# Patient Record
Sex: Male | Born: 1980 | Race: White | Hispanic: No | Marital: Married | Smoking: Current every day smoker
Health system: Southern US, Community
[De-identification: ages and names within clinical notes are randomized; demographics above are authoritative.]

## PROBLEM LIST (undated history)

## (undated) DIAGNOSIS — M549 Dorsalgia, unspecified: Secondary | ICD-10-CM

## (undated) HISTORY — PX: FOREARM SURGERY: SHX651

## (undated) HISTORY — PX: ADENOIDECTOMY: SUR15

## (undated) HISTORY — PX: TYMPANOSTOMY TUBE PLACEMENT: SHX32

---

## 2007-07-17 ENCOUNTER — Emergency Department (HOSPITAL_COMMUNITY): Admission: EM | Admit: 2007-07-17 | Discharge: 2007-07-18 | Payer: Self-pay | Admitting: Emergency Medicine

## 2007-07-19 ENCOUNTER — Inpatient Hospital Stay (HOSPITAL_COMMUNITY): Admission: EM | Admit: 2007-07-19 | Discharge: 2007-07-23 | Payer: Self-pay | Admitting: Emergency Medicine

## 2008-03-17 ENCOUNTER — Emergency Department (HOSPITAL_COMMUNITY): Admission: EM | Admit: 2008-03-17 | Discharge: 2008-03-17 | Payer: Self-pay | Admitting: Emergency Medicine

## 2008-07-21 ENCOUNTER — Emergency Department (HOSPITAL_COMMUNITY): Admission: EM | Admit: 2008-07-21 | Discharge: 2008-07-21 | Payer: Self-pay | Admitting: Emergency Medicine

## 2008-09-21 IMAGING — CR DG HAND COMPLETE 3+V*L*
3 series · 3 of 3 positions shown · non-contrast
Comparison: 07/17/2007

CLINICAL DATA: 26-year-old with infection in fourth digit with pain and swelling.
 LEFT HAND ? 3 VIEW:

[view not recorded (1 of 3)]
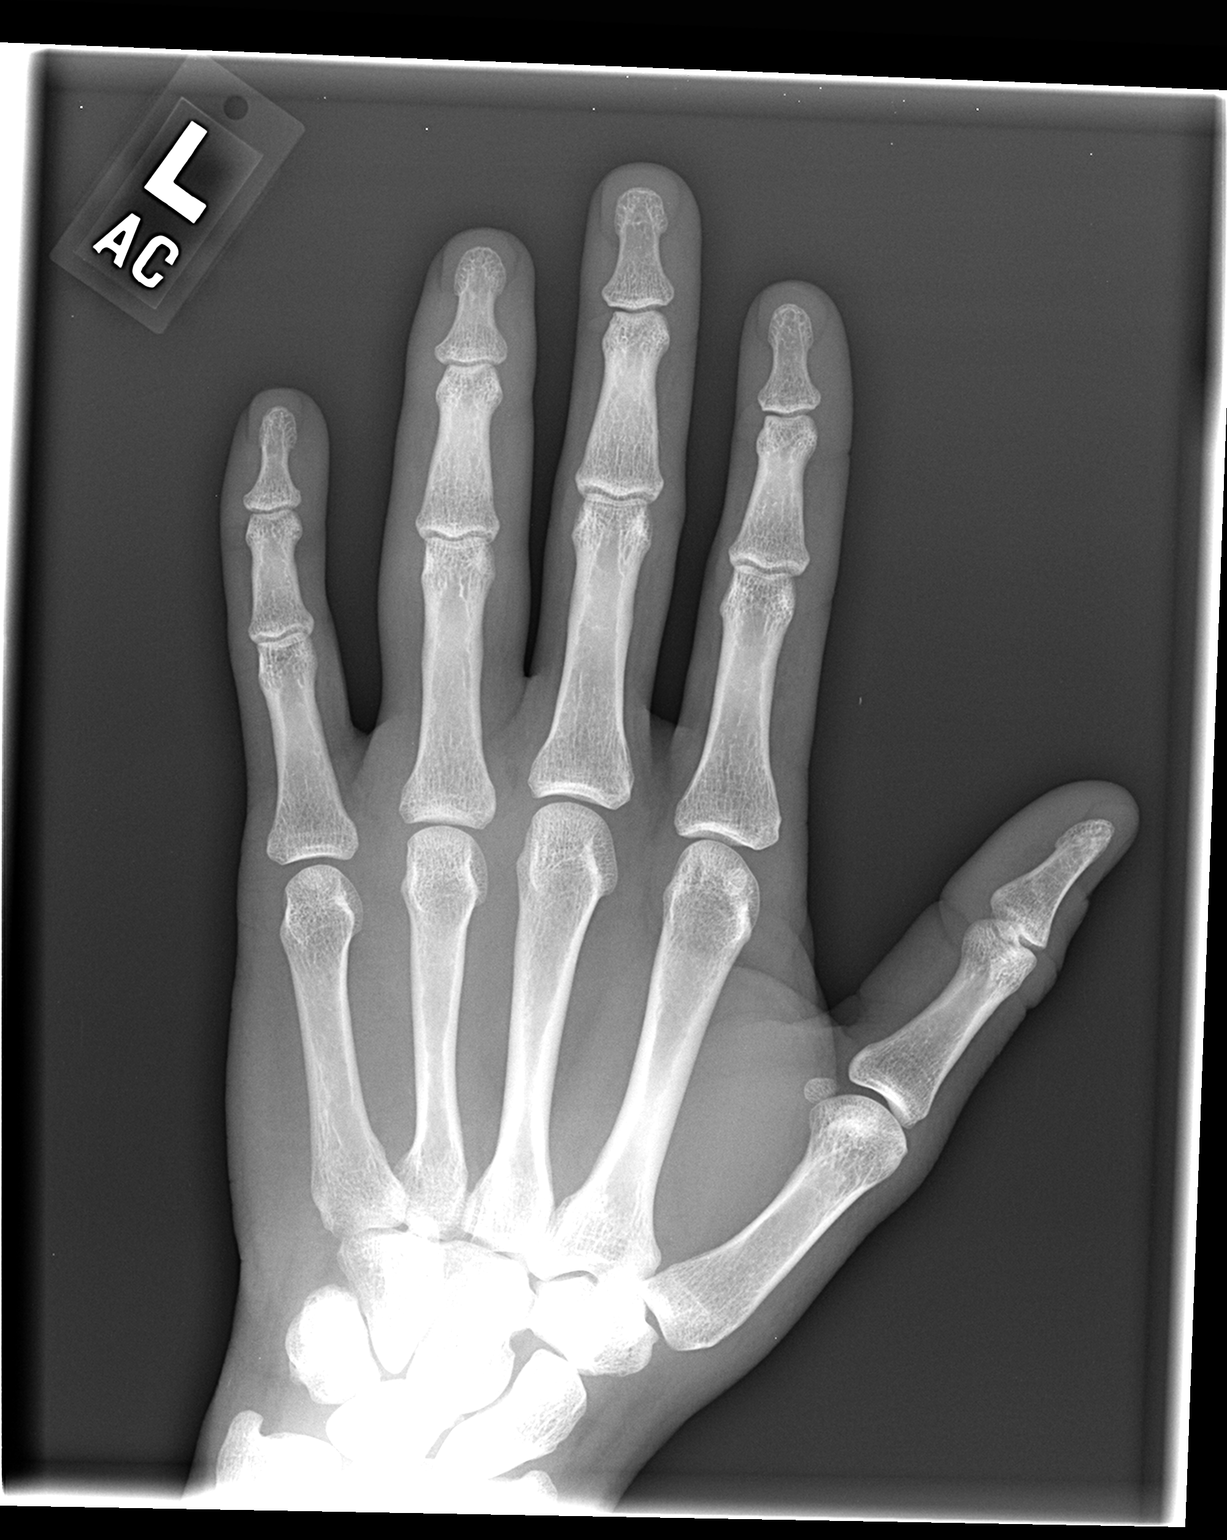

[view not recorded (2 of 3)]
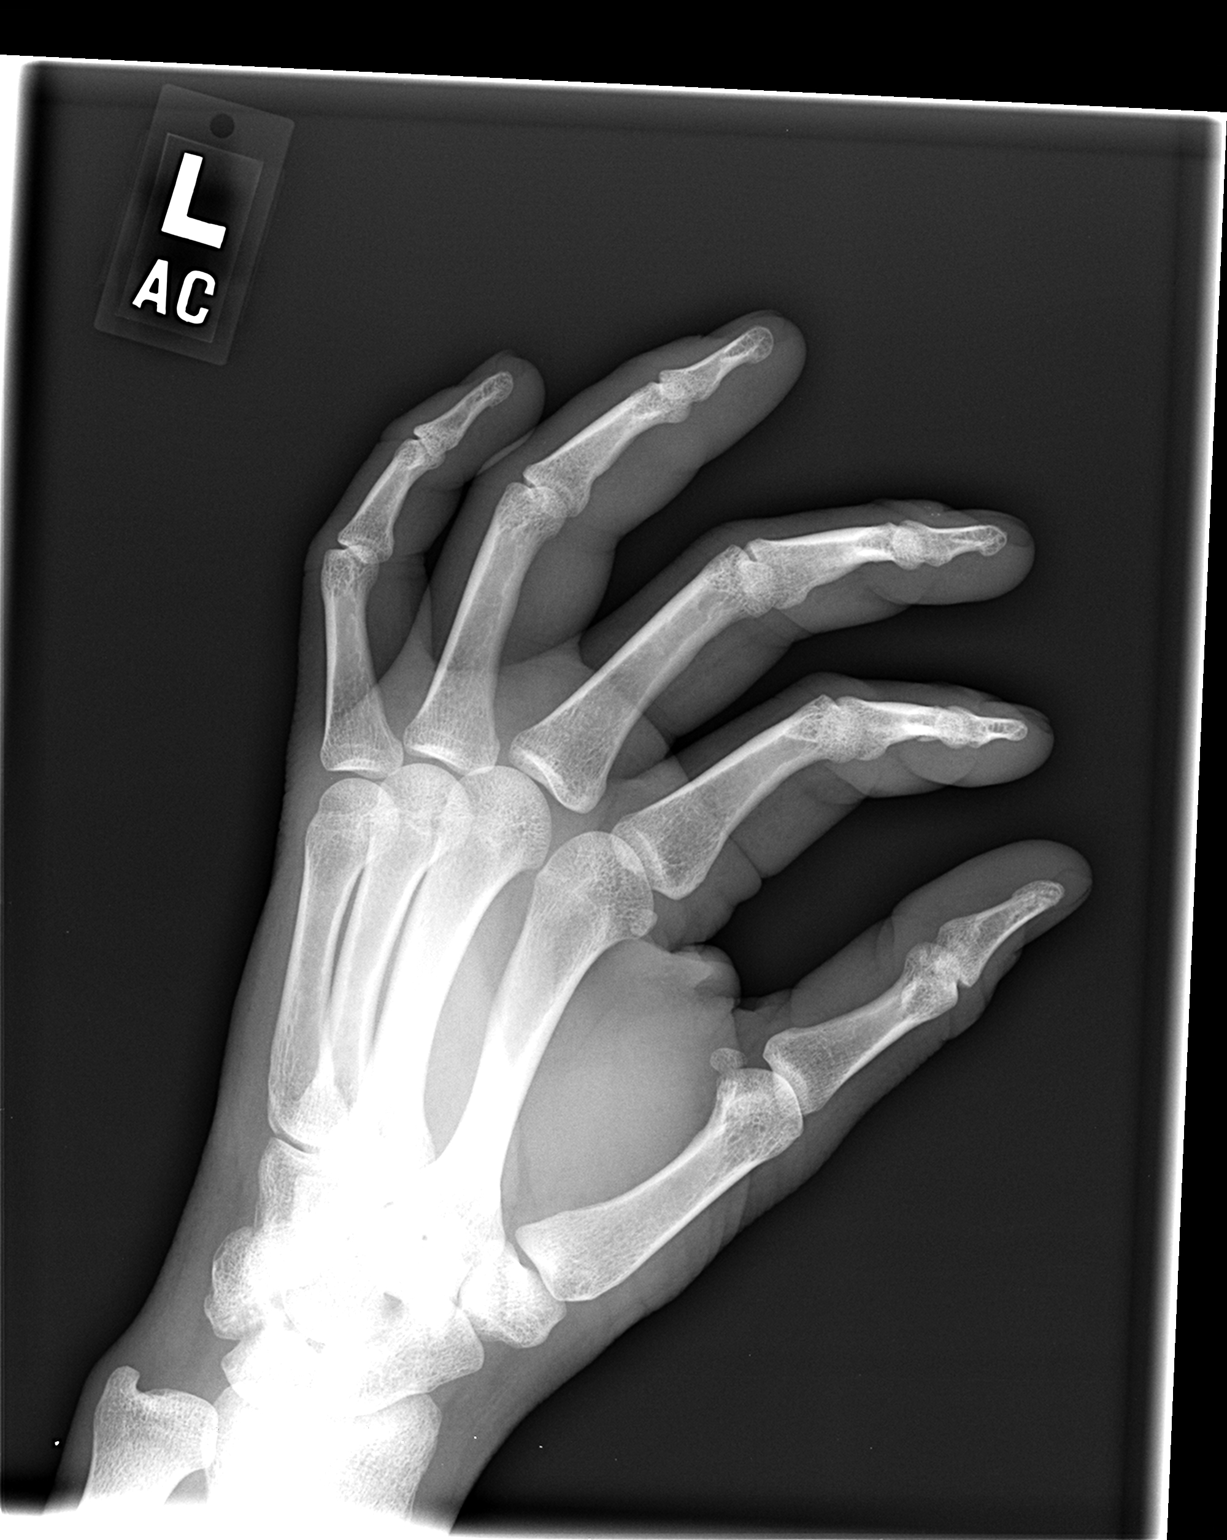

[view not recorded (3 of 3)]
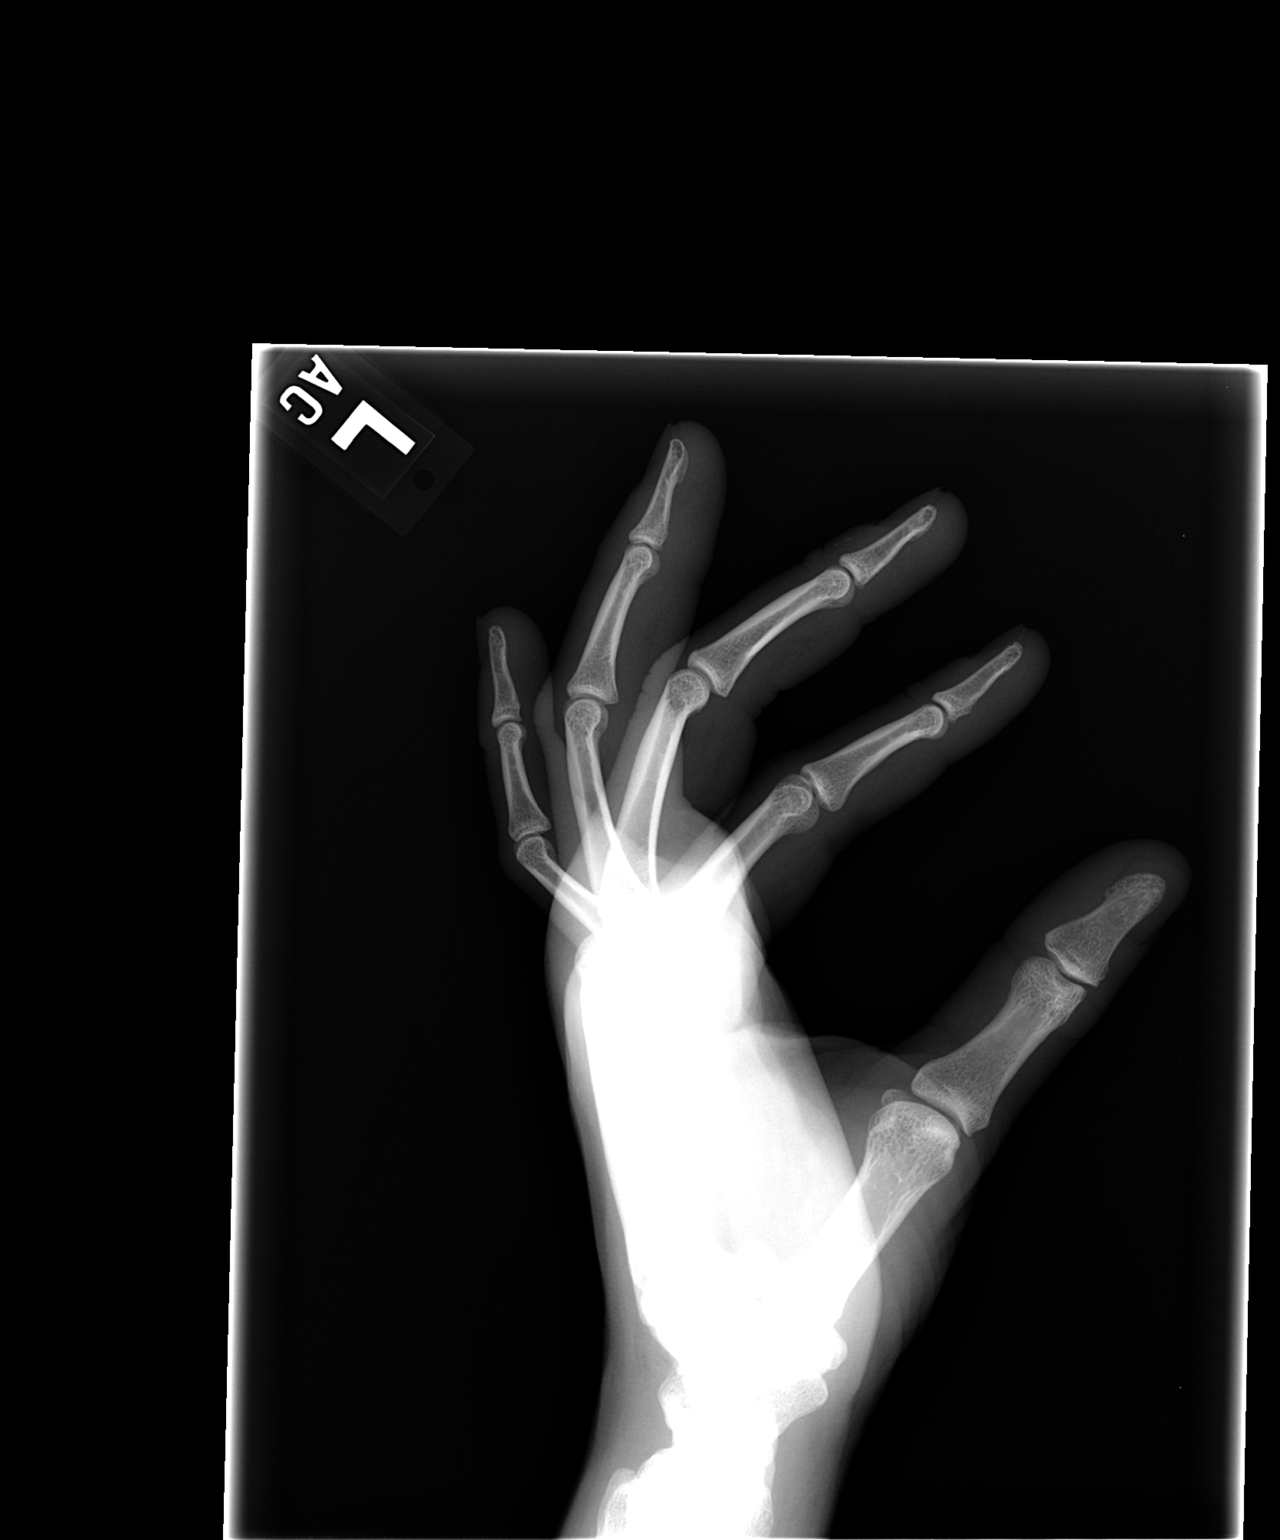

[3 of 3 positions shown; findings below may reference images not displayed]

FINDINGS: There is diffuse soft tissue swelling involving the left ring finger.  The joint spaces are maintained.  I do not see any findings suspicious for septic arthritis or osteomyelitis.  No fractures are seen. Remainder of the hand is unremarkable.
IMPRESSION: Diffuse soft tissue swelling involving the left ring finger but no acute bony findings, destructive bony changes or plain film findings suggestive of septic arthritis.

## 2008-09-23 ENCOUNTER — Emergency Department (HOSPITAL_COMMUNITY): Admission: EM | Admit: 2008-09-23 | Discharge: 2008-09-23 | Payer: Self-pay | Admitting: Emergency Medicine

## 2009-07-30 ENCOUNTER — Emergency Department (HOSPITAL_COMMUNITY): Admission: EM | Admit: 2009-07-30 | Discharge: 2009-07-30 | Payer: Self-pay | Admitting: Emergency Medicine

## 2011-01-17 NOTE — H&P (Signed)
Dillon Brown, Dillon Brown             ACCOUNT NO.:  [id number]   MEDICAL RECORD NO.:  [id number]          PATIENT TYPE:  INP   LOCATION:  A318                          FACILITY:  [facility]   PHYSICIAN:  Skeet Latch, DO    DATE OF BIRTH:  08/18/1981   DATE OF ADMISSION:  07/19/2007  DATE OF DISCHARGE:  LH                              HISTORY & PHYSICAL   CHIEF COMPLAINT:  Left hand swelling.   HISTORY OF PRESENT ILLNESS:  This is a 30 year old Caucasian male who  presents with left hand swelling.  Apparently, about a week and a half  ago, the patient states that he noticed that his left ring finger  started to swell at the tip.  This has gradually been increasing over  the last week.  The patient does a lot of work with his hands and is  unsure if this is related to the type of work he does or if he was  bitten by some type of insect.  Patient presented to the emergency room  on November 15th with similar problems.  He was given an antibiotic and  pain reliever.  Patient states that he took this medication the next  day, and the finger looked worse, so he came back to the emergency room  for evaluation.  Patient has no significant medical conditions and does  not see a physician regularly.   PAST MEDICAL HISTORY:  Unremarkable.   FAMILY HISTORY:  Unremarkable.   PAST SURGICAL HISTORY:  Adenoidectomy.   SOCIAL HISTORY:  One pack per day smoker for approximately 10 years.  States that he occasionally drinks alcohol.  He denies any drug abuse.   ALLERGIES:  No known drug allergies.   CURRENT HOME MEDICATIONS:  Patient was taking Bactrim as directed and  Vicodin as needed.   REVIEW OF SYSTEMS:  The only pertinent positives is left ring finger  swelling and pain.  CARDIOVASCULAR:  Unremarkable.  GENITOURINARY:  Unremarkable.  HEENT:  Unremarkable.  GASTROINTESTINAL:  Unremarkable.  NEUROLOGIC:  Unremarkable.  Other systems are unremarkable.   PHYSICAL EXAMINATION:  Temperature 98.9,  pulse 80, respirations 18,  blood pressure 97/62.  GENERAL:  Patient is well-developed, well-nourished, well-hydrated in no  acute distress.  Pleasant, cooperative, alert and oriented.  HEENT:  Head is atraumatic and normocephalic.  No scleral icterus is  noted.  NECK:  Soft, supple, nontender, nondistended.  CARDIOVASCULAR:  Regular rate and rhythm.  No murmurs, rubs or gallops.  LUNGS:  Clear to auscultation bilaterally.  No rales, rhonchi or  wheezes.  ABDOMEN:  Soft, nontender, nondistended.  No rigidity or guarding.  Positive bowel sounds.  EXTREMITIES:  Left ring finger has erythematous swelling that extends to  his metacarpals.  No drainage noted.  He has painful palpation and when  he flexes his finger.  NEUROLOGIC:  Cranial nerves II-XII are grossly intact.  PSYCHIATRIC:  Alert and oriented x3.  Normal affect.   RADIOLOGIC STUDIES:  Left hand x-ray shows no acute bony findings.   ASSESSMENT:  This is a 30 year old Caucasian male who presents with a 1-  1/2 week history  of left ring finger swelling, erythema, and pain.  Patient was seen in the emergency room one day prior and placed on  antibiotics and pain medication.   PLAN:  Cellulitis.  Patient was placed on vancomycin and Ancef in the  emergency room.  This will be continued at this time.  Also have gotten  blood cultures x2.  Await findings from his blood culture.  Patient will  be given antiemetics as well as IV pain medication.  This will be  adjusted as needed.  Patient will be followed closely.      Skeet Latch, DO  Electronically Signed     SM/MEDQ  D:  07/20/2007  T:  07/20/2007  Job:  [id number]

## 2011-01-17 NOTE — Group Therapy Note (Signed)
Dillon Brown, Dillon Brown             ACCOUNT NO.:  [id number]   MEDICAL RECORD NO.:  [id number]          PATIENT TYPE:  INP   LOCATION:  A318                          FACILITY:  [facility]   PHYSICIAN:  Skeet Latch, DO    DATE OF BIRTH:  August 06, 1981   DATE OF PROCEDURE:  07/19/2007  DATE OF DISCHARGE:                                 PROGRESS NOTE   SUBJECTIVE:  The patient was complaining of finger pain today after he  awoke this morning. The patient states that the swelling and redness  this morning increased, but as the day has progressed, the pain has  improved.   OBJECTIVE:  VITAL SIGNS:  Temperature 97.5, respirations 20, heart rate  62, blood pressure 92/63.  CARDIOVASCULAR:  Regular rate and rhythm with no murmurs, rubs, or  gallops.  LUNGS:  Clear to auscultation bilaterally. No rales, rhonchi, or  wheezing.  ABDOMEN:  Soft, nontender, nondistended.  MUSCULOSKELETAL:  Left ring finger is still slightly swollen. Redness  has decreased. There are no streaks noted. Tenderness is still present,  but improved today.   LABORATORY DATA:  Pending.   ASSESSMENT AND PLAN:  1. Cellulitis. The patient continues to be on IV vancomycin and Ancef.      Blood cultures so far are negative. The patient does not have a      white count or a temperature at this time. We will continue for a      few more days of IV antibiotics and we will switch to p.o. in the      next 24 to 48 hours if he continues to improve.  2. Tobacco abuse. We will give the patient a nicotine patch and      counsel him regarding his tobacco abuse.   Anticipate discharge in the next few days if the patient improves and  remains afebrile.      Skeet Latch, DO  Electronically Signed     SM/MEDQ  D:  07/21/2007  T:  07/22/2007  Job:  [id number]

## 2011-01-17 NOTE — Discharge Summary (Signed)
Dillon Brown, Dillon Brown             ACCOUNT NO.:  [id number]   MEDICAL RECORD NO.:  [id number]          PATIENT TYPE:  INP   LOCATION:  A318                          FACILITY:  [facility]   PHYSICIAN:  Skeet Latch, DO    DATE OF BIRTH:  1981/05/02   DATE OF ADMISSION:  07/19/2007  DATE OF DISCHARGE:  11/18/2008LH                               DISCHARGE SUMMARY   DISCHARGE DIAGNOSES:  1. Cellulitis.  2. Tobacco abuse.   BRIEF HOSPITAL COURSE:  This is a 30 year old Caucasian male who  presented with left hand/finger swelling.  Apparently about a week and  half prior, the patient states he notices his left ring finger started  to swell at the tip.  This gradually increased over last week to involve  his whole left ring finger.  The patient does a lot of work with his  hands and states that he noticed this and was unsure of the cause.  The  patient went to the emergency room one day prior, was given antibiotic  and pain reliever.  He took his medication, and the next day, he said it  looked worse and came back to the emergency room for evaluation.  The  patient was admitted to a general medical bed for cellulitis of his left  ring finger.  The patient was placed on IV vancomycin and Ancef, and  then blood cultures were obtained.  His blood cultures were negative,  and his lab work was pretty much benign.  The patient continued be on IV  antibiotics and IV pain medication, and ice and elevation was applied to  the left hand.  The patient's pain is slowly improved as well as his  swelling and redness.  The patient did have a hand x-ray which showed  diffuse soft tissue swelling along the left ring finger but no acute  bony findings, no destructive bony changes, no suggestion of septic  arthritis.  At this time, feel patient is stable enough to be discharged  to home.   His vitals on discharge:  Temperature is 98.1, pulse 83, respirations  20, blood pressure 125/69.  His last labs:  Sodium  141, potassium 4.3,  chloride 103, CO2 is 31, glucose 79, BUN 9, creatinine 1.02.  White  count 6.3, hemoglobin 14.1, hematocrit 41.2, platelet count 201.   MEDICATIONS ON DISCHARGE:  Will include:  1. Levaquin 500 mg 1 tablet p.o. daily for 7 days.  2. Vicodin 1-2 tablets p.o. q.4-6h. p.r.n.   CONDITION ON DISCHARGE:  Stable.   DISPOSITION:  The patient will be discharged to home.   INSTRUCTIONS:  The patient is told to follow up with primary care  physician if needed, if swelling does not improve.  The patient is  instructed to maintain a regular diet and increase activity as  tolerated.  The patient is also told to return to the emergency room if  swelling does not improve or if he finds streaks down his arm or any  severe pain.      Skeet Latch, DO  Electronically Signed     SM/MEDQ  D:  07/23/2007  T:  07/23/2007  Job:  [id number]

## 2011-06-13 LAB — DIFFERENTIAL
Basophils Absolute: 0.1
Basophils Absolute: 0.1
Basophils Relative: 1
Eosinophils Relative: 17 — ABNORMAL HIGH
Eosinophils Relative: 18 — ABNORMAL HIGH
Eosinophils Relative: 7 — ABNORMAL HIGH
Lymphocytes Relative: 11 — ABNORMAL LOW
Lymphocytes Relative: 31
Lymphocytes Relative: 13
Lymphs Abs: 1.6
Monocytes Absolute: 0.8
Monocytes Relative: 9
Monocytes Relative: 8
Neutro Abs: 7.7
Neutrophils Relative %: 49
Neutrophils Relative %: 71

## 2011-06-13 LAB — CBC
HCT: 40.3
Hemoglobin: 14
Hemoglobin: 14.1
Hemoglobin: 13.7
MCHC: 33.8
MCHC: 34
MCV: 93.7
MCV: 94
MCV: 94.7
RBC: 4.37
RBC: 4.39
RBC: 4.4
RBC: 4.31
RDW: 12.4
RDW: 12.5
RDW: 12.8
WBC: 10.2
WBC: 6.3

## 2011-06-13 LAB — BASIC METABOLIC PANEL
BUN: 12
BUN: 9
CO2: 27
CO2: 27
Calcium: 8.5
Calcium: 8.8
Calcium: 8.9
Chloride: 102
Chloride: 103
Creatinine, Ser: 1.02
GFR calc Af Amer: >60
GFR calc non Af Amer: >60
GFR calc non Af Amer: >60
GFR calc non Af Amer: >60
GFR calc non Af Amer: >60
Glucose, Bld: 136 — ABNORMAL HIGH
Glucose, Bld: 79
Glucose, Bld: 90
Glucose, Bld: 99
Potassium: 4.1
Sodium: 137
Sodium: 141

## 2011-06-13 LAB — CULTURE, BLOOD (ROUTINE X 2)
Culture: NO GROWTH
Report Status: 11192008

## 2011-06-13 LAB — VANCOMYCIN, TROUGH: Vancomycin Tr: 8.9

## 2011-06-13 LAB — URINALYSIS, ROUTINE W REFLEX MICROSCOPIC: pH: 7.5

## 2011-08-01 ENCOUNTER — Encounter: Payer: Self-pay | Admitting: Emergency Medicine

## 2011-08-01 ENCOUNTER — Emergency Department (HOSPITAL_COMMUNITY): Payer: No Typology Code available for payment source

## 2011-08-01 ENCOUNTER — Emergency Department (HOSPITAL_COMMUNITY)
Admission: EM | Admit: 2011-08-01 | Discharge: 2011-08-01 | Disposition: A | Discharge status: Home / Self Care | Payer: No Typology Code available for payment source | Attending: Emergency Medicine | Admitting: Emergency Medicine

## 2011-08-01 DIAGNOSIS — R072 Precordial pain: Secondary | ICD-10-CM | POA: Insufficient documentation

## 2011-08-01 DIAGNOSIS — F17200 Nicotine dependence, unspecified, uncomplicated: Secondary | ICD-10-CM | POA: Insufficient documentation

## 2011-08-01 HISTORY — DX: Dorsalgia, unspecified: M54.9

## 2011-08-01 LAB — COMPREHENSIVE METABOLIC PANEL
AST: 17 U/L (ref 0–37)
Albumin: 4 g/dL (ref 3.5–5.2)
Alkaline Phosphatase: 90 U/L (ref 39–117)
BUN: 12 mg/dL (ref 6–23)
Chloride: 102 mEq/L (ref 96–112)
Potassium: 3.7 mEq/L (ref 3.5–5.1)
Total Bilirubin: 0.4 mg/dL (ref 0.3–1.2)

## 2011-08-01 LAB — CBC
HCT: 45.1 % (ref 39.0–52.0)
Platelets: 203 10*3/uL (ref 150–400)
RBC: 4.88 MIL/uL (ref 4.22–5.81)
RDW: 12.4 % (ref 11.5–15.5)

## 2011-08-01 MED ORDER — ACETAMINOPHEN 325 MG PO TABS
650.0000 mg | ORAL_TABLET | Freq: Once | ORAL | Status: AC
  Administered 2011-08-01: 650 mg via ORAL
  Filled 2011-08-01: qty 2

## 2011-08-01 MED ORDER — ASPIRIN 81 MG PO CHEW
324.0000 mg | CHEWABLE_TABLET | Freq: Once | ORAL | Status: AC
  Administered 2011-08-01: 324 mg via ORAL
  Filled 2011-08-01: qty 4

## 2011-08-01 MED ORDER — NITROGLYCERIN 2 % TD OINT
1.0000 [in_us] | TOPICAL_OINTMENT | Freq: Four times a day (QID) | TRANSDERMAL | Status: DC
Start: 2011-08-01 — End: 2011-08-01
  Administered 2011-08-01: 1 [in_us] via TOPICAL
  Filled 2011-08-01: qty 30

## 2011-08-01 MED ORDER — HYDROCODONE-ACETAMINOPHEN 5-325 MG PO TABS: 1.0000 | ORAL_TABLET | Freq: Four times a day (QID) | ORAL | Status: AC | PRN

## 2011-08-01 MED ORDER — SODIUM CHLORIDE 0.9 % IV SOLN
999.0000 mL | INTRAVENOUS | Status: DC
  Administered 2011-08-01: 1000 mL via INTRAVENOUS

## 2011-08-01 MED ORDER — NAPROXEN 500 MG PO TABS: 500.0000 mg | ORAL_TABLET | Freq: Two times a day (BID) | ORAL | Status: AC

## 2011-08-01 MED ORDER — NITROGLYCERIN 0.4 MG SL SUBL
0.4000 mg | SUBLINGUAL_TABLET | SUBLINGUAL | Status: DC | PRN
  Administered 2011-08-01 (×2): 0.4 mg via SUBLINGUAL
  Filled 2011-08-01: qty 25

## 2011-08-01 NOTE — ED Notes (Signed)
Patient c/o mid-sternal chest pain that radiates into left side of chest since yesterday. Patient states "I went to work this morning thinking it would just go away but it didn't." Patient reports wheezing, coughing, and shortness of breath.

## 2011-08-01 NOTE — ED Provider Notes (Signed)
History  This chart was scribed for Celene Kras, MD by Bennett Scrape. This patient was seen in room [facility] and the patient's care was started at 12:37PM.  CSN: [id number] Arrival date & time: 08/01/2011 12:20 PM   First MD Initiated Contact with Patient 08/01/11 1228      Chief Complaint  Patient presents with  . Chest Pain    The history is provided by the patient. No language interpreter was used.   Dillon Brown is a 30 y.o. male who presents to the Emergency Department complaining of 16 hours of gradual onset, gradually worsening constant chest pain described as an achy feeling that is located along his mid-sternal and radiates into the left side of his chest. Pt reports that the pain began last night while he was working on his son's dirt bike. Pt states that laying down improves the symptoms. Pt also c/o mild intermittent SOB, intermittent blurred vision, tingling sensations in his face, intermittent weakness in both legs and mild lower back pain. Pt denies cough, fevers and leg swelling as associated symptoms. Pt denies any recent travel, injuries, cardiovascular or respiratory problems.  Past Medical History  Diagnosis Date  . Back pain     Past Surgical History  Procedure Date  . Adenoidectomy   . Tympanostomy tube placement   . Forearm surgery     left-compound fracture    History reviewed. No pertinent family history.  History  Substance Use Topics  . Smoking status: Current Everyday Smoker -- 1.0 packs/day for 15 years    Types: Cigarettes  . Smokeless tobacco: Never Used  . Alcohol Use: 7.2 oz/week    12 Cans of beer per week      Review of Systems A complete 10 system review of systems was obtained and is otherwise negative except as noted in the HPI.   Allergies  Review of patient's allergies indicates no known allergies.  Home Medications  No current outpatient prescriptions on file.  Triage Vitals: BP 124/70  Pulse 93  Temp(Src) 98 F  (36.7 C) (Oral)  Resp 17  Ht 6\' 1"  (1.854 m)  Wt 210 lb (95.255 kg)  BMI 27.71 kg/m2  SpO2 97%  Physical Exam  Nursing note and vitals reviewed. Constitutional: He appears well-developed and well-nourished. No distress.  HENT:  Head: Normocephalic and atraumatic.  Right Ear: External ear normal.  Left Ear: External ear normal.  Eyes: Conjunctivae are normal. Right eye exhibits no discharge. Left eye exhibits no discharge. No scleral icterus.  Neck: Neck supple. No tracheal deviation present.  Cardiovascular: Normal rate, regular rhythm and intact distal pulses.   Pulmonary/Chest: Effort normal and breath sounds normal. No stridor. No respiratory distress. He has no wheezes. He has no rales. He exhibits tenderness (chest wall tenderness mid-sternum).  Abdominal: Soft. Bowel sounds are normal. He exhibits no distension. There is no tenderness. There is no rebound and no guarding.  Musculoskeletal: He exhibits no edema and no tenderness.  Neurological: He is alert. He has normal strength. No sensory deficit. Cranial nerve deficit:  no gross defecits noted. He exhibits normal muscle tone. He displays no seizure activity. Coordination normal.  Skin: Skin is warm and dry. No rash noted.  Psychiatric: He has a normal mood and affect.    ED Course  Procedures (including critical care time)  Date: 08/01/2011  Rate: 82  Rhythm: normal sinus rhythm  QRS Axis: normal  Intervals: normal  ST/T Wave abnormalities: normal  Conduction Disutrbances:none  Narrative  Interpretation:   Old EKG Reviewed: none available   DIAGNOSTIC STUDIES: Oxygen Saturation is 97% on room air, normal by my interpretation.    COORDINATION OF CARE: 12:39PM-Discussed treatment plan with patient at bedside and patient agreed to plan.   Labs Reviewed  COMPREHENSIVE METABOLIC PANEL - Abnormal; Notable for the following:    Glucose, Bld 104 (*)    All other components within normal limits  CBC  POCT I-STAT  TROPONIN I  I-STAT TROPONIN I   Dg Chest Portable 1 View  08/01/2011  *RADIOLOGY REPORT*  Clinical Data: Chest pain and shortness of breath.  PORTABLE CHEST - 1 VIEW  Comparison: None  Findings: The cardiac silhouette, mediastinal and hilar contours are within normal limits.  The lungs are clear.  No pleural effusion.  The bony thorax is intact.  IMPRESSION: No acute cardiopulmonary findings.  Original Report Authenticated By: P. Loralie Champagne, M.D.   Dx: Chest Pain   MDM  Patient presents with midsternal chest pain. The pain has been ongoing since yesterday. He has normal cardiac markers.  There is noted some pneumonia on his chest x-ray.   He does have some chest wall tenderness. His pain may very well be more musculoskeletal in nature. I doubt acute coronary syndrome, pulmonary embolism, aortic dissection, pneumothorax.   I personally performed the services described in this documentation, which was scribed in my presence.  The recorded information has been reviewed and considered.     Celene Kras, MD 08/01/11 [phone]

## 2012-12-02 ENCOUNTER — Encounter (HOSPITAL_COMMUNITY): Payer: Self-pay | Admitting: *Deleted

## 2012-12-02 ENCOUNTER — Emergency Department (HOSPITAL_COMMUNITY): Payer: Self-pay

## 2012-12-02 ENCOUNTER — Emergency Department (HOSPITAL_COMMUNITY)
Admission: EM | Admit: 2012-12-02 | Discharge: 2012-12-02 | Disposition: A | Discharge status: Home / Self Care | Payer: Self-pay | Attending: Emergency Medicine | Admitting: Emergency Medicine

## 2012-12-02 DIAGNOSIS — Z79899 Other long term (current) drug therapy: Secondary | ICD-10-CM | POA: Insufficient documentation

## 2012-12-02 DIAGNOSIS — S93402A Sprain of unspecified ligament of left ankle, initial encounter: Secondary | ICD-10-CM

## 2012-12-02 DIAGNOSIS — Y9389 Activity, other specified: Secondary | ICD-10-CM | POA: Insufficient documentation

## 2012-12-02 DIAGNOSIS — F17200 Nicotine dependence, unspecified, uncomplicated: Secondary | ICD-10-CM | POA: Insufficient documentation

## 2012-12-02 DIAGNOSIS — S93409A Sprain of unspecified ligament of unspecified ankle, initial encounter: Secondary | ICD-10-CM | POA: Insufficient documentation

## 2012-12-02 DIAGNOSIS — X500XXA Overexertion from strenuous movement or load, initial encounter: Secondary | ICD-10-CM | POA: Insufficient documentation

## 2012-12-02 DIAGNOSIS — Y9289 Other specified places as the place of occurrence of the external cause: Secondary | ICD-10-CM | POA: Insufficient documentation

## 2012-12-02 MED ORDER — HYDROCODONE-ACETAMINOPHEN 5-325 MG PO TABS: ORAL_TABLET | ORAL | Status: DC

## 2012-12-02 MED ORDER — NAPROXEN 500 MG PO TABS: 500.0000 mg | ORAL_TABLET | Freq: Two times a day (BID) | ORAL | Status: DC

## 2012-12-02 MED ORDER — IBUPROFEN 800 MG PO TABS
800.0000 mg | ORAL_TABLET | Freq: Once | ORAL | Status: AC
  Administered 2012-12-02: 800 mg via ORAL
  Filled 2012-12-02: qty 1

## 2012-12-02 MED ORDER — HYDROCODONE-ACETAMINOPHEN 5-325 MG PO TABS
1.0000 | ORAL_TABLET | Freq: Once | ORAL | Status: AC
  Administered 2012-12-02: 1 via ORAL
  Filled 2012-12-02: qty 1

## 2012-12-02 NOTE — ED Notes (Signed)
Pain lt ankle, stepped on brick chip and "rolled my ankle"

## 2012-12-02 NOTE — ED Provider Notes (Signed)
History     CSN: [id number]  Arrival date & time 12/02/12  1527   First MD Initiated Contact with Patient 12/02/12 1702      Chief Complaint  Patient presents with  . Ankle Pain    (Consider location/radiation/quality/duration/timing/severity/associated sxs/prior treatment) HPI Comments: Patient complains of pain to the lateral left ankle that began several hours ago after he" twisted" his ankle. He reports a previous same ankle one month ago. He states he did not have orthopedic followup after his previous injury. He denies proximal tenderness, extremity weakness or numbness or pain to his foot. He has not tried any home therapies prior to coming to the ED  Patient is a 32 y.o. male presenting with ankle pain. The history is provided by the patient.  Ankle Pain Location:  Ankle Time since incident:  4 hours Injury: yes   Mechanism of injury comment:  Torsion Ankle location:  L ankle Pain details:    Quality:  Aching and throbbing   Radiates to:  Does not radiate   Severity:  Moderate   Timing:  Constant Chronicity:  Recurrent Dislocation: no   Foreign body present:  No foreign bodies Prior injury to area:  Yes Relieved by:  Elevation Worsened by:  Activity, bearing weight and rotation Ineffective treatments:  None tried Associated symptoms: swelling   Associated symptoms: no back pain, no decreased ROM, no fatigue, no fever, no itching, no muscle weakness, no neck pain, no numbness and no tingling     Past Medical History  Diagnosis Date  . Back pain     Past Surgical History  Procedure Laterality Date  . Adenoidectomy    . Tympanostomy tube placement    . Forearm surgery      left-compound fracture    History reviewed. No pertinent family history.  History  Substance Use Topics  . Smoking status: Current Every Day Smoker -- 1.00 packs/day for 15 years    Types: Cigarettes  . Smokeless tobacco: Never Used  . Alcohol Use: 7.2 oz/week    12 Cans of beer per  week      Review of Systems  Constitutional: Negative for fever, chills and fatigue.  HENT: Negative for neck pain.   Genitourinary: Negative for dysuria and difficulty urinating.  Musculoskeletal: Positive for joint swelling and arthralgias. Negative for back pain.  Skin: Negative for color change, itching and wound.  All other systems reviewed and are negative.    Allergies  Review of patient's allergies indicates no known allergies.  Home Medications   Current Outpatient Rx  Name  Route  Sig  Dispense  Refill  . acetaminophen (TYLENOL) 500 MG tablet   Oral   Take 1,000 mg by mouth every 6 (six) hours as needed. Pain          . ALPRAZolam (XANAX) 1 MG tablet   Oral   Take 1 mg by mouth daily as needed.           . traMADol (ULTRAM) 50 MG tablet   Oral   Take 50 mg by mouth every 6 (six) hours as needed. Maximum dose= 8 tablets per day. Pain            BP 102/77  Pulse 97  Temp(Src) 97.8 F (36.6 C) (Oral)  Resp 18  Ht 6\' 1"  (1.854 m)  Wt 200 lb (90.719 kg)  BMI 26.39 kg/m2  SpO2 99%  Physical Exam  Nursing note and vitals reviewed. Constitutional: He is oriented to  person, place, and time. He appears well-developed and well-nourished. No distress.  HENT:  Head: Normocephalic and atraumatic.  Cardiovascular: Normal rate, regular rhythm, normal heart sounds and intact distal pulses.   Pulmonary/Chest: Effort normal and breath sounds normal.  Musculoskeletal: He exhibits tenderness.  Left ankle is ttp, mild to moderate STS is present.  ROM is preserved.  DP pulse is brisk, distal sensation intact.  No erythema, abrasion, bruising or bony deformity.    Neurological: He is alert and oriented to person, place, and time. He exhibits normal muscle tone. Coordination normal.  Skin: Skin is warm and dry.    ED Course  Procedures (including critical care time)  Labs Reviewed - No data to display Dg Ankle Complete Left  12/02/2012  *RADIOLOGY REPORT*   Clinical Data: Ankle injury with pain and swelling.  LEFT ANKLE COMPLETE - 3+ VIEW  Comparison: None.  Findings: Three-view exam shows no fracture.  No subluxation or dislocation.  Ankle mortise is preserved.  Mild soft tissue swelling is evident.  IMPRESSION: No acute bony findings.   Original Report Authenticated By: Kennith Center, M.D.     aso splint applied, pain improved, remains NV intact    MDM    X-ray report was viewed by me and considered, nursing notes were also reviewed and considered  Tenderness to palpation of the lateral left ankle mild soft tissue swelling is present. No abrasions or bruit or the proximal tenderness. Distal sensation is intact , likely ankle sprain.   Her patient agrees to elevate and apply ice, use crutches for weightbearing. He also agrees to follow with Dr. Romeo Apple if needed.  Prescribed  Norco #20 Naproxen  Mariaha Ellington L. Trisha Mangle, PA-C 12/02/12 1719

## 2012-12-03 NOTE — ED Provider Notes (Signed)
Medical screening examination/treatment/procedure(s) were performed by non-physician practitioner and as supervising physician I was immediately available for consultation/collaboration.   Laray Anger, DO 12/03/12 1010

## 2013-04-12 ENCOUNTER — Emergency Department (HOSPITAL_COMMUNITY)
Admission: EM | Admit: 2013-04-12 | Discharge: 2013-04-12 | Disposition: A | Discharge status: Home / Self Care | Payer: Self-pay | Attending: Emergency Medicine | Admitting: Emergency Medicine

## 2013-04-12 ENCOUNTER — Encounter (HOSPITAL_COMMUNITY): Payer: Self-pay | Admitting: *Deleted

## 2013-04-12 DIAGNOSIS — K047 Periapical abscess without sinus: Secondary | ICD-10-CM | POA: Insufficient documentation

## 2013-04-12 DIAGNOSIS — R110 Nausea: Secondary | ICD-10-CM | POA: Insufficient documentation

## 2013-04-12 DIAGNOSIS — F17200 Nicotine dependence, unspecified, uncomplicated: Secondary | ICD-10-CM | POA: Insufficient documentation

## 2013-04-12 DIAGNOSIS — Z791 Long term (current) use of non-steroidal anti-inflammatories (NSAID): Secondary | ICD-10-CM | POA: Insufficient documentation

## 2013-04-12 DIAGNOSIS — H9209 Otalgia, unspecified ear: Secondary | ICD-10-CM | POA: Insufficient documentation

## 2013-04-12 DIAGNOSIS — Z79899 Other long term (current) drug therapy: Secondary | ICD-10-CM | POA: Insufficient documentation

## 2013-04-12 DIAGNOSIS — R220 Localized swelling, mass and lump, head: Secondary | ICD-10-CM | POA: Insufficient documentation

## 2013-04-12 MED ORDER — AMOXICILLIN 500 MG PO CAPS: 500.0000 mg | ORAL_CAPSULE | Freq: Three times a day (TID) | ORAL | Status: DC

## 2013-04-12 MED ORDER — PROMETHAZINE HCL 25 MG PO TABS: 25.0000 mg | ORAL_TABLET | Freq: Four times a day (QID) | ORAL | Status: DC | PRN

## 2013-04-12 MED ORDER — HYDROCODONE-ACETAMINOPHEN 5-325 MG PO TABS: 1.0000 | ORAL_TABLET | ORAL | Status: DC | PRN

## 2013-04-12 NOTE — ED Provider Notes (Signed)
CSN: [id number]     Arrival date & time 04/12/13  1701 History     None    Chief Complaint  Patient presents with  . Dental Pain  . Otalgia   (Consider location/radiation/quality/duration/timing/severity/associated sxs/prior Treatment) Patient is a 32 y.o. male presenting with tooth pain. The history is provided by the patient.  Dental Pain Location:  Lower Lower teeth location:  17/LL 3rd molar and 20/LL 2nd bicuspid Quality:  Throbbing Severity:  Moderate Onset quality:  Gradual Duration:  1 week Timing:  Constant Progression:  Worsening Chronicity:  New Context: abscess   Relieved by:  Nothing Ineffective treatments:  NSAIDs Associated symptoms: no fever and no headaches   Associated symptoms comment:  Ear pain  Dillon Brown is a 32 y.o. male who presents to the ED with dental pain that started a week ago and has gotten much worse over the past 2 days. He states that this morning he had nausea and swelling of his gum of the lower left. When he pressed the area it started bleeding and draining. He recently broke the tip of the left lower wisdom tooth and it has been painful. He has a dentist but the pain today was to bad to wait till Monday. The pain radiates to the left ear.   Past Medical History  Diagnosis Date  . Back pain    Past Surgical History  Procedure Laterality Date  . Adenoidectomy    . Tympanostomy tube placement    . Forearm surgery      left-compound fracture   No family history on file. History  Substance Use Topics  . Smoking status: Current Every Day Smoker -- 1.00 packs/day for 15 years    Types: Cigarettes  . Smokeless tobacco: Never Used  . Alcohol Use: 7.2 oz/week    12 Cans of beer per week    Review of Systems  Constitutional: Negative for fever and chills.  HENT: Positive for ear pain and dental problem.   Respiratory: Negative for cough and shortness of breath.   Gastrointestinal: Positive for nausea. Negative for vomiting.    Neurological: Negative for dizziness and headaches.  Psychiatric/Behavioral: The patient is not nervous/anxious.     Allergies  Review of patient's allergies indicates no known allergies.  Home Medications   Current Outpatient Rx  Name  Route  Sig  Dispense  Refill  . acetaminophen (TYLENOL) 500 MG tablet   Oral   Take 1,000 mg by mouth every 6 (six) hours as needed. Pain          . ALPRAZolam (XANAX) 1 MG tablet   Oral   Take 1 mg by mouth daily as needed.           Marland Kitchen HYDROcodone-acetaminophen (NORCO/VICODIN) 5-325 MG per tablet      Take one-two tabs po q 4-6 hrs prn pain   20 tablet   0   . naproxen (NAPROSYN) 500 MG tablet   Oral   Take 1 tablet (500 mg total) by mouth 2 (two) times daily with a meal.   20 tablet   0   . traMADol (ULTRAM) 50 MG tablet   Oral   Take 50 mg by mouth every 6 (six) hours as needed. Maximum dose= 8 tablets per day. Pain           BP 125/84  Pulse 93  Temp(Src) 98.4 F (36.9 C)  Resp 20  SpO2 99% Physical Exam  Nursing note and vitals reviewed. Constitutional:  He is oriented to person, place, and time. He appears well-developed and well-nourished. No distress.  HENT:  Head: Normocephalic.  Right Ear: Tympanic membrane normal.  Left Ear: Tympanic membrane normal.  Mouth/Throat: Uvula is midline and mucous membranes are normal.    Tenderness of gum surrounding #20 and #17. Dental abscess # 20.  Eyes: Conjunctivae and EOM are normal.  Neck: Neck supple.  Pulmonary/Chest: Effort normal.  Musculoskeletal: Normal range of motion.  Neurological: He is alert and oriented to person, place, and time. No cranial nerve deficit.  Skin: Skin is warm and dry.  Psychiatric: He has a normal mood and affect. His behavior is normal.    ED Course   Procedures   MDM  32 y.o. male with dental abscess. Will treat with antibiotics and pain medication and patient will follow up with his dentist ASAP.  Discussed with the patient and all  questioned fully answered. He will return if any problems arise.    Medication List    STOP taking these medications       acetaminophen 500 MG tablet  Commonly known as:  TYLENOL     traMADol 50 MG tablet  Commonly known as:  ULTRAM      TAKE these medications       amoxicillin 500 MG capsule  Commonly known as:  AMOXIL  Take 1 capsule (500 mg total) by mouth 3 (three) times daily.     HYDROcodone-acetaminophen 5-325 MG per tablet  Commonly known as:  NORCO/VICODIN  Take 1 tablet by mouth every 4 (four) hours as needed.     promethazine 25 MG tablet  Commonly known as:  PHENERGAN  Take 1 tablet (25 mg total) by mouth every 6 (six) hours as needed for nausea.      ASK your doctor about these medications       ALPRAZolam 1 MG tablet  Commonly known as:  XANAX  Take 1 mg by mouth daily as needed.     naproxen 500 MG tablet  Commonly known as:  NAPROSYN  Take 1 tablet (500 mg total) by mouth 2 (two) times daily with a meal.         Janne Napoleon, NP 04/12/13 1754

## 2013-04-12 NOTE — ED Notes (Signed)
Pt given 3 scripts and d/c papers verbalized understanding of all.

## 2013-04-12 NOTE — ED Provider Notes (Signed)
History/physical exam/procedure(s) were performed by non-physician practitioner and as supervising physician I was immediately available for consultation/collaboration. I have reviewed all notes and am in agreement with care and plan.   Akeria Hedstrom S Kerria Sapien, MD 04/12/13 2001 

## 2013-04-12 NOTE — ED Notes (Signed)
Pt c/o left upper dental pain , left earache that started a few days ago,

## 2013-07-19 ENCOUNTER — Encounter (HOSPITAL_COMMUNITY): Payer: Self-pay | Admitting: Emergency Medicine

## 2013-07-19 ENCOUNTER — Emergency Department (HOSPITAL_COMMUNITY)
Admission: EM | Admit: 2013-07-19 | Discharge: 2013-07-19 | Disposition: A | Discharge status: Home / Self Care | Payer: No Typology Code available for payment source | Attending: Emergency Medicine | Admitting: Emergency Medicine

## 2013-07-19 DIAGNOSIS — F17200 Nicotine dependence, unspecified, uncomplicated: Secondary | ICD-10-CM | POA: Insufficient documentation

## 2013-07-19 DIAGNOSIS — Z792 Long term (current) use of antibiotics: Secondary | ICD-10-CM | POA: Insufficient documentation

## 2013-07-19 DIAGNOSIS — Z791 Long term (current) use of non-steroidal anti-inflammatories (NSAID): Secondary | ICD-10-CM | POA: Insufficient documentation

## 2013-07-19 DIAGNOSIS — IMO0002 Reserved for concepts with insufficient information to code with codable children: Secondary | ICD-10-CM | POA: Insufficient documentation

## 2013-07-19 DIAGNOSIS — Y9389 Activity, other specified: Secondary | ICD-10-CM | POA: Insufficient documentation

## 2013-07-19 DIAGNOSIS — S61451A Open bite of right hand, initial encounter: Secondary | ICD-10-CM

## 2013-07-19 DIAGNOSIS — M549 Dorsalgia, unspecified: Secondary | ICD-10-CM | POA: Insufficient documentation

## 2013-07-19 DIAGNOSIS — W540XXA Bitten by dog, initial encounter: Secondary | ICD-10-CM | POA: Insufficient documentation

## 2013-07-19 DIAGNOSIS — Y929 Unspecified place or not applicable: Secondary | ICD-10-CM | POA: Insufficient documentation

## 2013-07-19 DIAGNOSIS — Z23 Encounter for immunization: Secondary | ICD-10-CM | POA: Insufficient documentation

## 2013-07-19 MED ORDER — IBUPROFEN 800 MG PO TABS
800.0000 mg | ORAL_TABLET | Freq: Once | ORAL | Status: AC
  Administered 2013-07-19: 800 mg via ORAL
  Filled 2013-07-19: qty 1

## 2013-07-19 MED ORDER — AMOXICILLIN-POT CLAVULANATE 875-125 MG PO TABS
1.0000 | ORAL_TABLET | Freq: Once | ORAL | Status: AC
  Administered 2013-07-19: 1 via ORAL
  Filled 2013-07-19: qty 1

## 2013-07-19 MED ORDER — TETANUS-DIPHTH-ACELL PERTUSSIS 5-2.5-18.5 LF-MCG/0.5 IM SUSP
0.5000 mL | Freq: Once | INTRAMUSCULAR | Status: AC
  Administered 2013-07-19: 0.5 mL via INTRAMUSCULAR
  Filled 2013-07-19: qty 0.5

## 2013-07-19 MED ORDER — HYDROCODONE-ACETAMINOPHEN 5-325 MG PO TABS: 1.0000 | ORAL_TABLET | ORAL | Status: DC | PRN

## 2013-07-19 MED ORDER — HYDROCODONE-ACETAMINOPHEN 5-325 MG PO TABS
1.0000 | ORAL_TABLET | Freq: Once | ORAL | Status: AC
  Administered 2013-07-19: 1 via ORAL
  Filled 2013-07-19: qty 1

## 2013-07-19 MED ORDER — AMOXICILLIN-POT CLAVULANATE 875-125 MG PO TABS: 1.0000 | ORAL_TABLET | Freq: Two times a day (BID) | ORAL | Status: DC

## 2013-07-19 MED ORDER — IBUPROFEN 800 MG PO TABS: 800.0000 mg | ORAL_TABLET | Freq: Three times a day (TID) | ORAL | Status: DC

## 2013-07-19 NOTE — ED Notes (Signed)
[facility] notified of dog bite, advised pt to contact them at [phone] once he gets home

## 2013-07-19 NOTE — ED Provider Notes (Signed)
CSN: [id number]     Arrival date & time 07/19/13  1656 History   First MD Initiated Contact with Patient 07/19/13 1705     Chief Complaint  Patient presents with  . Animal Bite   (Consider location/radiation/quality/duration/timing/severity/associated sxs/prior Treatment) Patient is a 32 y.o. male presenting with animal bite. The history is provided by the patient.  Animal Bite Contact animal:  Dog Location:  Hand Hand injury location:  R fingers Pain details:    Quality:  Aching   Severity:  Moderate   Timing:  Constant   Progression:  Worsening Incident location:  Another residence Notifications:  None Animal's rabies vaccination status:  Never received Animal in possession: yes   Tetanus status:  Out of date Relieved by:  Nothing Worsened by:  Nothing tried Ineffective treatments:  None tried Associated symptoms: no fever     Past Medical History  Diagnosis Date  . Back pain    Past Surgical History  Procedure Laterality Date  . Adenoidectomy    . Tympanostomy tube placement    . Forearm surgery      left-compound fracture   No family history on file. History  Substance Use Topics  . Smoking status: Current Every Day Smoker -- 1.00 packs/day for 15 years    Types: Cigarettes  . Smokeless tobacco: Never Used  . Alcohol Use: 7.2 oz/week    12 Cans of beer per week    Review of Systems  Constitutional: Negative for fever and activity change.       All ROS Neg except as noted in HPI  HENT: Negative for nosebleeds.   Eyes: Negative for photophobia and discharge.  Respiratory: Negative for cough, shortness of breath and wheezing.   Cardiovascular: Negative for chest pain and palpitations.  Gastrointestinal: Negative for abdominal pain and blood in stool.  Genitourinary: Negative for dysuria, frequency and hematuria.  Musculoskeletal: Positive for arthralgias and back pain. Negative for neck pain.  Skin: Positive for wound.  Neurological: Negative for  dizziness, seizures and speech difficulty.  Psychiatric/Behavioral: Negative for hallucinations and confusion.    Allergies  Review of patient's allergies indicates no known allergies.  Home Medications   Current Outpatient Rx  Name  Route  Sig  Dispense  Refill  . ALPRAZolam (XANAX) 1 MG tablet   Oral   Take 1 mg by mouth daily as needed.           Marland Kitchen amoxicillin (AMOXIL) 500 MG capsule   Oral   Take 1 capsule (500 mg total) by mouth 3 (three) times daily.   21 capsule   0   . HYDROcodone-acetaminophen (NORCO/VICODIN) 5-325 MG per tablet   Oral   Take 1 tablet by mouth every 4 (four) hours as needed.   15 tablet   0   . naproxen (NAPROSYN) 500 MG tablet   Oral   Take 1 tablet (500 mg total) by mouth 2 (two) times daily with a meal.   20 tablet   0   . promethazine (PHENERGAN) 25 MG tablet   Oral   Take 1 tablet (25 mg total) by mouth every 6 (six) hours as needed for nausea.   30 tablet   0    BP 93/73  Pulse 98  Ht 6\' 1"  (1.854 m)  Wt 265 lb (120.203 kg)  BMI 34.97 kg/m2  SpO2 96% Physical Exam  Nursing note and vitals reviewed. Constitutional: He is oriented to person, place, and time. He appears well-developed and well-nourished.  Non-toxic appearance.  HENT:  Head: Normocephalic.  Right Ear: Tympanic membrane and external ear normal.  Left Ear: Tympanic membrane and external ear normal.  Eyes: EOM and lids are normal. Pupils are equal, round, and reactive to light.  Neck: Normal range of motion. Neck supple. Carotid bruit is not present.  Cardiovascular: Normal rate, regular rhythm, normal heart sounds, intact distal pulses and normal pulses.   Pulmonary/Chest: Breath sounds normal. No respiratory distress.  Abdominal: Soft. Bowel sounds are normal. There is no tenderness. There is no guarding.  Musculoskeletal: Normal range of motion.  There is a shallow abrasion to the dorsum of the right hand. There is a laceration of the lateral aspect of the third  finger on the right hand. There are 2 shallow puncture marks to the lateral aspect of the right fifth finger. There is full range of motion of all fingers. Is full range of motion of the wrist. Radial pulses are 2+ bilaterally.  Lymphadenopathy:       Head (right side): No submandibular adenopathy present.       Head (left side): No submandibular adenopathy present.    He has no cervical adenopathy.  Neurological: He is alert and oriented to person, place, and time. He has normal strength. No cranial nerve deficit or sensory deficit.  Skin: Skin is warm and dry.  Psychiatric: He has a normal mood and affect. His speech is normal.    ED Course  Procedures (including critical care time) Labs Review Labs Reviewed - No data to display Imaging Review No results found.  EKG Interpretation   None       MDM  No diagnosis found. **I have reviewed nursing notes, vital signs, and all appropriate lab and imaging results for this patient.*  Patient states he was breaking up a fight between his dog and his cousin's dog. He states that the cousin's dog has not had any rabies or other vaccinations. The dog is in possession of his cousin and able to be monitored. The patient states that his last tetanus shot was 6 years ago. His tetanus was updated today.  The plan at this time is for the patient to be started on Augmentin. He is to followup with his primary physician for additional evaluation since these bites involve the hand. He is treated with ibuprofen and Norco for pain. Animal control has been called to evaluate the dog's that were involved.  Kathie Dike, PA-C 07/19/13 [phone]

## 2013-07-19 NOTE — ED Notes (Signed)
Dog bite to left hand, states he was trying to break up a dog fight, twisted left ankle while breaking up fight

## 2013-07-20 NOTE — ED Provider Notes (Signed)
Medical screening examination/treatment/procedure(s) were performed by non-physician practitioner and as supervising physician I was immediately available for consultation/collaboration.  EKG Interpretation   None         Laray Anger, DO 07/20/13 0009

## 2013-10-04 ENCOUNTER — Encounter (HOSPITAL_COMMUNITY): Payer: Self-pay | Admitting: Emergency Medicine

## 2013-10-04 ENCOUNTER — Emergency Department (HOSPITAL_COMMUNITY)
Admission: EM | Admit: 2013-10-04 | Discharge: 2013-10-04 | Disposition: A | Discharge status: Home / Self Care | Payer: No Typology Code available for payment source | Attending: Emergency Medicine | Admitting: Emergency Medicine

## 2013-10-04 DIAGNOSIS — Z791 Long term (current) use of non-steroidal anti-inflammatories (NSAID): Secondary | ICD-10-CM | POA: Insufficient documentation

## 2013-10-04 DIAGNOSIS — K5289 Other specified noninfective gastroenteritis and colitis: Secondary | ICD-10-CM | POA: Insufficient documentation

## 2013-10-04 DIAGNOSIS — R509 Fever, unspecified: Secondary | ICD-10-CM | POA: Insufficient documentation

## 2013-10-04 DIAGNOSIS — K529 Noninfective gastroenteritis and colitis, unspecified: Secondary | ICD-10-CM

## 2013-10-04 DIAGNOSIS — R5383 Other fatigue: Secondary | ICD-10-CM

## 2013-10-04 DIAGNOSIS — Z792 Long term (current) use of antibiotics: Secondary | ICD-10-CM | POA: Insufficient documentation

## 2013-10-04 DIAGNOSIS — R52 Pain, unspecified: Secondary | ICD-10-CM | POA: Insufficient documentation

## 2013-10-04 DIAGNOSIS — R5381 Other malaise: Secondary | ICD-10-CM | POA: Insufficient documentation

## 2013-10-04 DIAGNOSIS — F17200 Nicotine dependence, unspecified, uncomplicated: Secondary | ICD-10-CM | POA: Insufficient documentation

## 2013-10-04 LAB — OCCULT BLOOD, POC DEVICE: Fecal Occult Bld: NEGATIVE

## 2013-10-04 MED ORDER — ONDANSETRON 8 MG PO TBDP
8.0000 mg | ORAL_TABLET | Freq: Once | ORAL | Status: AC
  Administered 2013-10-04: 8 mg via ORAL
  Filled 2013-10-04: qty 1

## 2013-10-04 MED ORDER — ONDANSETRON HCL 8 MG PO TABS: 8.0000 mg | ORAL_TABLET | Freq: Three times a day (TID) | ORAL | Status: DC | PRN

## 2013-10-04 MED ORDER — ACETAMINOPHEN 325 MG PO TABS
650.0000 mg | ORAL_TABLET | Freq: Once | ORAL | Status: AC
Start: 2013-10-04 — End: 2013-10-04
  Administered 2013-10-04: 650 mg via ORAL
  Filled 2013-10-04: qty 2

## 2013-10-04 MED ORDER — OXYCODONE-ACETAMINOPHEN 5-325 MG PO TABS
1.0000 | ORAL_TABLET | Freq: Once | ORAL | Status: AC
  Administered 2013-10-04: 1 via ORAL
  Filled 2013-10-04: qty 1

## 2013-10-04 NOTE — ED Notes (Signed)
Pt reports vomiting, diarrhea & body aches that started yesterday around 1700.

## 2013-10-04 NOTE — ED Provider Notes (Signed)
CSN: [id number]     Arrival date & time 10/04/13  0111 History   First MD Initiated Contact with Patient 10/04/13 0124     Chief Complaint  Patient presents with  . Emesis  . Diarrhea  . Generalized Body Aches    Patient is a 33 y.o. male presenting with vomiting and diarrhea. The history is provided by the patient.  Emesis Severity:  Moderate Duration:  7 hours Timing:  Intermittent Progression:  Worsening Chronicity:  New Relieved by:  Nothing Worsened by:  Nothing tried Associated symptoms: abdominal pain, chills, diarrhea, fever and myalgias   Associated symptoms: no cough   Risk factors: sick contacts   Risk factors: no travel to endemic areas   Diarrhea Associated symptoms: abdominal pain, chills, fever, myalgias and vomiting   Associated symptoms: no recent cough   pt reports multiple episodes of vomiting/diarrhea He reports his stool was dark but no blood reported   Past Medical History  Diagnosis Date  . Back pain    Past Surgical History  Procedure Laterality Date  . Adenoidectomy    . Tympanostomy tube placement    . Forearm surgery      left-compound fracture   No family history on file. History  Substance Use Topics  . Smoking status: Current Every Day Smoker -- 1.00 packs/day for 15 years    Types: Cigarettes  . Smokeless tobacco: Never Used  . Alcohol Use: 7.2 oz/week    12 Cans of beer per week    Review of Systems  Constitutional: Positive for fever and chills.  Respiratory: Negative for cough.   Gastrointestinal: Positive for vomiting, abdominal pain and diarrhea.  Musculoskeletal: Positive for myalgias.  Neurological: Positive for weakness.  All other systems reviewed and are negative.    Allergies  Codeine  Home Medications   Current Outpatient Rx  Name  Route  Sig  Dispense  Refill  . Aspirin-Acetaminophen-Caffeine (GOODY HEADACHE PO)   Oral   Take 1 packet by mouth daily as needed (FOR PAIN).         Marland Kitchen. ibuprofen  (ADVIL,MOTRIN) 200 MG tablet   Oral   Take 200 mg by mouth every 6 (six) hours as needed.         . promethazine (PHENERGAN) 25 MG tablet   Oral   Take 25 mg by mouth every 6 (six) hours as needed for nausea or vomiting.         Marland Kitchen. amoxicillin-clavulanate (AUGMENTIN) 875-125 MG per tablet   Oral   Take 1 tablet by mouth 2 (two) times daily.   14 tablet   0   . HYDROcodone-acetaminophen (NORCO) 5-325 MG per tablet   Oral   Take 1 tablet by mouth every 4 (four) hours as needed for moderate pain.   15 tablet   0   . ibuprofen (ADVIL,MOTRIN) 800 MG tablet   Oral   Take 1 tablet (800 mg total) by mouth 3 (three) times daily.   21 tablet   0    BP 100/84  Pulse 109  Temp(Src) 98.7 F (37.1 C) (Oral)  Resp 20  Ht 6\' 1"  (1.854 m)  Wt 230 lb (104.327 kg)  BMI 30.35 kg/m2  SpO2 99% Physical Exam CONSTITUTIONAL: Well developed/well nourished HEAD: Normocephalic/atraumatic EYES: EOMI/PERRL, no icterus ENMT: Mucous membranes dry NECK: supple no meningeal signs SPINE:entire spine nontender CV: S1/S2 noted, no murmurs/rubs/gallops noted LUNGS: Lungs are clear to auscultation bilaterally, no apparent distress ABDOMEN: soft, nontender, no rebound or guarding  GU:no cva tenderness Rectal - stool color brown.  No blood or melena noted.  Hemoccult negative.  Chaperone present NEURO: Pt is awake/alert, moves all extremitiesx4 EXTREMITIES: pulses normal, full ROM SKIN: warm, color normal PSYCH: no abnormalities of mood noted  ED Course  Procedures (including critical care time)  After oral meds and PO fluids, pt feels improved, he reports his abdominal discomfort is improved, he is ambulatory and in no distress.  Suspect viral gastroenteritis.  I doubt acute abdominal emergency/appendicitis at this time We discussed strict return precautions Labs Review Labs Reviewed  OCCULT BLOOD, POC DEVICE   Imaging Review No results found.  EKG Interpretation   None       MDM   No diagnosis found. Nursing notes including past medical history and social history reviewed and considered in documentation Labs/vital reviewed and considered     Joya Gaskins, MD 10/04/13 [phone]

## 2013-10-04 NOTE — Discharge Instructions (Signed)
. °  SEEK IMMEDIATE MEDICAL ATTENTION IF:  Repeated vomiting occurs (multiple episodes).  The pain becomes localized to portions of the abdomen over the next 8 hours. The right side could possibly be appendicitis. In an adult, the left lower portion of the abdomen could be colitis or diverticulitis.  Blood is being passed in stools or vomit (bright red or black tarry stools).  Return also if you develop chest pain, difficulty breathing, dizziness or fainting, or become confused, poorly responsive

## 2013-11-01 ENCOUNTER — Emergency Department (HOSPITAL_COMMUNITY): Payer: Self-pay

## 2013-11-01 ENCOUNTER — Emergency Department (HOSPITAL_COMMUNITY)
Admission: EM | Admit: 2013-11-01 | Discharge: 2013-11-01 | Disposition: A | Discharge status: Home / Self Care | Payer: Self-pay | Attending: Emergency Medicine | Admitting: Emergency Medicine

## 2013-11-01 ENCOUNTER — Encounter (HOSPITAL_COMMUNITY): Payer: Self-pay | Admitting: Emergency Medicine

## 2013-11-01 DIAGNOSIS — S63509A Unspecified sprain of unspecified wrist, initial encounter: Secondary | ICD-10-CM | POA: Insufficient documentation

## 2013-11-01 DIAGNOSIS — M549 Dorsalgia, unspecified: Secondary | ICD-10-CM

## 2013-11-01 DIAGNOSIS — F17200 Nicotine dependence, unspecified, uncomplicated: Secondary | ICD-10-CM | POA: Insufficient documentation

## 2013-11-01 DIAGNOSIS — S63502A Unspecified sprain of left wrist, initial encounter: Secondary | ICD-10-CM

## 2013-11-01 DIAGNOSIS — W19XXXA Unspecified fall, initial encounter: Secondary | ICD-10-CM

## 2013-11-01 DIAGNOSIS — Y939 Activity, unspecified: Secondary | ICD-10-CM | POA: Insufficient documentation

## 2013-11-01 DIAGNOSIS — IMO0002 Reserved for concepts with insufficient information to code with codable children: Secondary | ICD-10-CM | POA: Insufficient documentation

## 2013-11-01 DIAGNOSIS — Y929 Unspecified place or not applicable: Secondary | ICD-10-CM | POA: Insufficient documentation

## 2013-11-01 DIAGNOSIS — W010XXA Fall on same level from slipping, tripping and stumbling without subsequent striking against object, initial encounter: Secondary | ICD-10-CM | POA: Insufficient documentation

## 2013-11-01 MED ORDER — OXYCODONE-ACETAMINOPHEN 5-325 MG PO TABS
2.0000 | ORAL_TABLET | Freq: Once | ORAL | Status: AC
  Administered 2013-11-01: 2 via ORAL
  Filled 2013-11-01: qty 2

## 2013-11-01 MED ORDER — OXYCODONE-ACETAMINOPHEN 5-325 MG PO TABS: 1.0000 | ORAL_TABLET | ORAL | Status: DC | PRN

## 2013-11-01 NOTE — ED Notes (Addendum)
Pt reporting falling on sacrum and now is experiencing pain in back, neck and left wrist.  Reports injury happened about 9pm.

## 2013-11-01 NOTE — ED Provider Notes (Signed)
CSN: [id number]     Arrival date & time 11/01/13  0207 History   First MD Initiated Contact with Patient 11/01/13 [phone]     Chief Complaint  Patient presents with  . Back Pain      Patient is a 33 y.o. male presenting with fall. The history is provided by the patient.  Fall This is a new problem. The current episode started 6 to 12 hours ago. The problem occurs constantly. Pertinent negatives include no chest pain, no abdominal pain and no shortness of breath. Exacerbated by: movement. The symptoms are relieved by rest. He has tried rest for the symptoms. The treatment provided no relief.  Pt reports he slipped and fell landing on his buttocks  He denies head injury.  No LOC No neck pain He reports pain in his low back He also reports pain in his left wrist from fall (landed on outstretched hand)  He denies any new weakness in his legs after fall No fecal/urinary incontinence reported     Past Medical History  Diagnosis Date  . Back pain    Past Surgical History  Procedure Laterality Date  . Adenoidectomy    . Tympanostomy tube placement    . Forearm surgery      left-compound fracture   History reviewed. No pertinent family history. History  Substance Use Topics  . Smoking status: Current Every Day Smoker -- 1.00 packs/day for 15 years    Types: Cigarettes  . Smokeless tobacco: Never Used  . Alcohol Use: 7.2 oz/week    12 Cans of beer per week    Review of Systems  Respiratory: Negative for shortness of breath.   Cardiovascular: Negative for chest pain.  Gastrointestinal: Negative for vomiting and abdominal pain.  Neurological: Negative for weakness.  All other systems reviewed and are negative.      Allergies  Codeine  Home Medications   Current Outpatient Rx  Name  Route  Sig  Dispense  Refill  . ibuprofen (ADVIL,MOTRIN) 200 MG tablet   Oral   Take 200 mg by mouth every 6 (six) hours as needed.         . ondansetron (ZOFRAN) 8 MG tablet   Oral   Take 1 tablet (8 mg total) by mouth every 8 (eight) hours as needed for nausea.   12 tablet   0   . oxyCODONE-acetaminophen (PERCOCET/ROXICET) 5-325 MG per tablet   Oral   Take 1 tablet by mouth every 4 (four) hours as needed for severe pain.   15 tablet   0   . promethazine (PHENERGAN) 25 MG tablet   Oral   Take 25 mg by mouth every 6 (six) hours as needed for nausea or vomiting.          BP 119/70  Pulse 83  Temp(Src) 98.7 F (37.1 C) (Oral)  Resp 18  Ht 6\' 1"  (1.854 m)  Wt 200 lb (90.719 kg)  BMI 26.39 kg/m2  SpO2 98% Physical Exam CONSTITUTIONAL: Well developed/well nourished HEAD: Normocephalic/atraumatic EYES: EOMI/PERRL ENMT: Mucous membranes moist NECK: supple no meningeal signs SPINE:no cervical or thoracic tenderness.  Lumbar tenderness noted.  No bruising/crepitance/stepoffs noted to spine CV: S1/S2 noted, no murmurs/rubs/gallops noted LUNGS: Lungs are clear to auscultation bilaterally, no apparent distress ABDOMEN: soft, nontender, no rebound or guarding GU:no cva tenderness NEURO: Pt is awake/alert, moves all extremitiesx4 equal distal motor 5/5 strength noted with the following: hip flexion/knee flexion/extension, ankle dorsi/plantar flexion, great toe extension intact bilaterally,no sensory deficit in  any dermatome.   EXTREMITIES: pulses normal, full ROM, left snuffbox tenderness noted, tenderness over left wrist, no tenderness to left elbow or left hand.  All other extremities/joints palpated/ranged and nontender SKIN: warm, color normal PSYCH: no abnormalities of mood noted  ED Course  Procedures Imaging Review Dg Lumbar Spine Complete  11/01/2013   CLINICAL DATA:  Lower back pain, status post fall.  EXAM: LUMBAR SPINE - COMPLETE 4+ VIEW  COMPARISON:  None.  FINDINGS: There is no evidence of fracture or subluxation. Vertebral bodies demonstrate normal height and alignment. Intervertebral disc spaces are preserved. The visualized neural foramina are  grossly unremarkable in appearance. Minimal anterior wedging of vertebral body T12 appears to be chronic in nature. There is suggestion of chronic bilateral pars defects at L5.  The visualized bowel gas pattern is unremarkable in appearance; air and stool are noted within the colon. The sacroiliac joints are within normal limits.  IMPRESSION: 1. No evidence of fracture or subluxation along the lumbar spine. 2. 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   Electronically Signed   By: Roanna RaiderJeffery  Chang M.D.   On: 11/01/2013 03:39   Dg Wrist Complete Left  11/01/2013   CLINICAL DATA:  Status post fall; left wrist pain.  EXAM: LEFT WRIST - COMPLETE 3+ VIEW  COMPARISON:  Left hand radiographs performed 07/19/2007  FINDINGS: There is no evidence of fracture or dislocation. The carpal rows are intact, and demonstrate normal alignment. The joint spaces are preserved. Positive ulnar variance is noted.  No significant soft tissue abnormalities are seen.  IMPRESSION: 1. No evidence of fracture or dislocation. 2. Positive ulnar variance noted.   Electronically Signed   By: Roanna RaiderJeffery  Chang M.D.   On: 11/01/2013 03:41    No acute fx noted However, since he landed on outstretched hand and +snuffbox tenderness, thumb spica applied, pt referred to ortho in 10 days.  Splint applied appropriately, able to move fingers on hand without difficulty  MDM   Final diagnoses:  Fall  Back pain  Left wrist sprain    Nursing notes including past medical history and social history reviewed and considered in documentation xrays reviewed and considered     Joya Gaskinsonald W Shahir Karen, MD 11/01/13 [phone]

## 2013-11-01 NOTE — Discharge Instructions (Signed)

## 2013-11-06 ENCOUNTER — Emergency Department (HOSPITAL_COMMUNITY)
Admission: EM | Admit: 2013-11-06 | Discharge: 2013-11-06 | Disposition: A | Discharge status: Home / Self Care | Payer: Self-pay | Attending: Emergency Medicine | Admitting: Emergency Medicine

## 2013-11-06 ENCOUNTER — Encounter (HOSPITAL_COMMUNITY): Payer: Self-pay | Admitting: Emergency Medicine

## 2013-11-06 DIAGNOSIS — M25539 Pain in unspecified wrist: Secondary | ICD-10-CM | POA: Insufficient documentation

## 2013-11-06 DIAGNOSIS — M25532 Pain in left wrist: Secondary | ICD-10-CM

## 2013-11-06 DIAGNOSIS — R209 Unspecified disturbances of skin sensation: Secondary | ICD-10-CM | POA: Insufficient documentation

## 2013-11-06 DIAGNOSIS — G8911 Acute pain due to trauma: Secondary | ICD-10-CM | POA: Insufficient documentation

## 2013-11-06 DIAGNOSIS — F17200 Nicotine dependence, unspecified, uncomplicated: Secondary | ICD-10-CM | POA: Insufficient documentation

## 2013-11-06 MED ORDER — IBUPROFEN 800 MG PO TABS: 800.0000 mg | ORAL_TABLET | Freq: Three times a day (TID) | ORAL | Status: DC

## 2013-11-06 MED ORDER — HYDROCODONE-ACETAMINOPHEN 5-325 MG PO TABS
1.0000 | ORAL_TABLET | Freq: Once | ORAL | Status: AC
  Administered 2013-11-06: 1 via ORAL
  Filled 2013-11-06: qty 1

## 2013-11-06 MED ORDER — HYDROCODONE-ACETAMINOPHEN 5-325 MG PO TABS
ORAL_TABLET | ORAL | Status: DC
Start: 2013-11-06 — End: 2017-09-20

## 2013-11-06 NOTE — ED Notes (Signed)
Thumb spica splint replaced.  Patient tolerated procedure well.

## 2013-11-06 NOTE — ED Notes (Signed)
Discharge instructions and prescription given and reviewed with patient.  Patient verbalized understanding of sedating effects of medication and to follow up with orthopedics for further evaluation.  Patient ambulatory; wife to drive home.

## 2013-11-06 NOTE — ED Notes (Signed)
Pt reporting wrist injury on the 27th, and was told to come back to have a better x-ray.  Reports that he is having continued pain.  States that he was prescribed Percocet and took last one yesterday.

## 2013-11-06 NOTE — Discharge Instructions (Signed)
Wrist Pain Wrist injuries are frequent in adults and children. A sprain is an injury to the ligaments that hold your bones together. A strain is an injury to muscle or muscle cord-like structures (tendons) from stretching or pulling. Generally, when wrists are moderately tender to touch following a fall or injury, a break in the bone (fracture) may be present. Most wrist sprains or strains are better in 3 to 5 days, but complete healing may take several weeks. HOME CARE INSTRUCTIONS   Put ice on the injured area.  Put ice in a plastic bag.  Place a towel between your skin and the bag.  Leave the ice on for 15-20 minutes, 03-04 times a day, for the first 2 days.  Keep your arm raised above the level of your heart whenever possible to reduce swelling and pain.  Rest the injured area for at least 48 hours or as directed by your caregiver.  If a splint or elastic bandage has been applied, use it for as long as directed by your caregiver or until seen by a caregiver for a follow-up exam.  Only take over-the-counter or prescription medicines for pain, discomfort, or fever as directed by your caregiver.  Keep all follow-up appointments. You may need to follow up with a specialist or have follow-up X-rays. Improvement in pain level is not a guarantee that you did not fracture a bone in your wrist. The only way to determine whether or not you have a broken bone is by X-ray. SEEK IMMEDIATE MEDICAL CARE IF:   Your fingers are swollen, very red, white, or cold and blue.  Your fingers are numb or tingling.  You have increasing pain.  You have difficulty moving your fingers. MAKE SURE YOU:   Understand these instructions.  Will watch your condition.  Will get help right away if you are not doing well or get worse. Document Released: 05/31/2005 Document Revised: 11/13/2011 Document Reviewed: 10/12/2010 [employer] Patient Information 2014 [employer].  

## 2013-11-07 NOTE — ED Provider Notes (Signed)
CSN: [id number]     Arrival date & time 11/06/13  2150 History   First MD Initiated Contact with Patient 11/06/13 2215     No chief complaint on file.    (Consider location/radiation/quality/duration/timing/severity/associated sxs/prior Treatment) HPI Comments: Dillon Brown is a 33 y.o. male who presents to the Emergency Department complaining of continued pain to the left wrist.  States he was seen here on 10/31/13 and treated for wrist pain after a fall onto an outstretched hand.  He c/o intermittent tingling to his left forearm and fourth and fifth fingers.  Patient states that he has ran out of pain medication and tried motrin w/o relief.  He states that he was advised that he may need to have another x-ray and he was unclear where he needed to follow-up  The history is provided by the patient.    Past Medical History  Diagnosis Date  . Back pain    Past Surgical History  Procedure Laterality Date  . Adenoidectomy    . Tympanostomy tube placement    . Forearm surgery      left-compound fracture   History reviewed. No pertinent family history. History  Substance Use Topics  . Smoking status: Current Every Day Smoker -- 1.00 packs/day for 15 years    Types: Cigarettes  . Smokeless tobacco: Never Used  . Alcohol Use: 7.2 oz/week    12 Cans of beer per week    Review of Systems  Constitutional: Negative for fever and chills.  Genitourinary: Negative for dysuria and difficulty urinating.  Musculoskeletal: Positive for arthralgias. Negative for back pain and joint swelling.  Skin: Negative for color change and wound.  Neurological: Negative for weakness.  All other systems reviewed and are negative.      Allergies  Codeine  Home Medications   Current Outpatient Rx  Name  Route  Sig  Dispense  Refill  . ibuprofen (ADVIL,MOTRIN) 200 MG tablet   Oral   Take 600 mg by mouth every 6 (six) hours as needed. pain         . HYDROcodone-acetaminophen (NORCO/VICODIN)  5-325 MG per tablet      Take one-two tabs po q 4-6 hrs prn pain   15 tablet   0   . ibuprofen (ADVIL,MOTRIN) 800 MG tablet   Oral   Take 1 tablet (800 mg total) by mouth 3 (three) times daily.   21 tablet   0    BP 130/89  Pulse 96  Temp(Src) 97.9 F (36.6 C) (Oral)  Resp 20  Ht 6\' 1"  (1.854 m)  Wt 200 lb (90.719 kg)  BMI 26.39 kg/m2  SpO2 98% Physical Exam  Nursing note and vitals reviewed. Constitutional: He is oriented to person, place, and time. He appears well-developed and well-nourished. No distress.  HENT:  Head: Normocephalic and atraumatic.  Cardiovascular: Normal rate, regular rhythm and normal heart sounds.   Pulmonary/Chest: Effort normal and breath sounds normal.  Musculoskeletal: He exhibits tenderness. He exhibits no edema.  ttp of the left anatomical snuffboxt.  Radial pulse is brisk, distal sensation intact.  CR< 2 sec.  No bony deformity.  Left elbow is NT.   Compartments soft.  Neurological: He is alert and oriented to person, place, and time. He exhibits normal muscle tone. Coordination normal.  Skin: Skin is warm and dry.    ED Course  Procedures (including critical care time) Labs Review Labs Reviewed - No data to display Imaging Review No results found.   EKG  Interpretation None      MDM   Final diagnoses:  Wrist pain, left    Previous ED chart reviewed and imaging from 10/31/13 also reviewed.    Pt wearing a thumb spica.  Removed for exam.  patient has continued tenderness of the snuffbox.  NV intact.  No discoloration of the skin, CR< 2 sec,  No distal edema.    No indication for further imaging at this time.  I have advised pt of possible occult fx and importance of orthopedic f/u.  Pt agrees to care plan and verbalized understanding.    The patient appears reasonably screened and/or stabilized for discharge and I doubt any other medical condition or other [facility] requiring further screening, evaluation, or treatment in the ED at this  time prior to discharge.    Berdena Cisek L. Antonella Upson, PA-C 11/07/13 0308

## 2013-11-10 NOTE — ED Provider Notes (Signed)
Medical screening examination/treatment/procedure(s) were performed by non-physician practitioner and as supervising physician I was immediately available for consultation/collaboration.   EKG Interpretation None     ]   Kayslee Furey L Aaralyn Kil, MD 11/10/13 0715 

## 2013-11-29 ENCOUNTER — Encounter (HOSPITAL_COMMUNITY): Payer: Self-pay | Admitting: Emergency Medicine

## 2013-11-29 ENCOUNTER — Emergency Department (HOSPITAL_COMMUNITY)
Admission: EM | Admit: 2013-11-29 | Discharge: 2013-11-29 | Disposition: A | Discharge status: Home / Self Care | Payer: Self-pay | Attending: Emergency Medicine | Admitting: Emergency Medicine

## 2013-11-29 DIAGNOSIS — Y939 Activity, unspecified: Secondary | ICD-10-CM | POA: Insufficient documentation

## 2013-11-29 DIAGNOSIS — Y929 Unspecified place or not applicable: Secondary | ICD-10-CM | POA: Insufficient documentation

## 2013-11-29 DIAGNOSIS — Z79899 Other long term (current) drug therapy: Secondary | ICD-10-CM | POA: Insufficient documentation

## 2013-11-29 DIAGNOSIS — W57XXXA Bitten or stung by nonvenomous insect and other nonvenomous arthropods, initial encounter: Principal | ICD-10-CM | POA: Insufficient documentation

## 2013-11-29 DIAGNOSIS — S30860A Insect bite (nonvenomous) of lower back and pelvis, initial encounter: Secondary | ICD-10-CM | POA: Insufficient documentation

## 2013-11-29 DIAGNOSIS — F17200 Nicotine dependence, unspecified, uncomplicated: Secondary | ICD-10-CM | POA: Insufficient documentation

## 2013-11-29 DIAGNOSIS — S30861A Insect bite (nonvenomous) of abdominal wall, initial encounter: Secondary | ICD-10-CM

## 2013-11-29 MED ORDER — MUPIROCIN CALCIUM 2 % EX CREA
TOPICAL_CREAM | Freq: Three times a day (TID) | CUTANEOUS | Status: DC
  Filled 2013-11-29: qty 15

## 2013-11-29 MED ORDER — DOXYCYCLINE HYCLATE 100 MG PO TABS
200.0000 mg | ORAL_TABLET | Freq: Once | ORAL | Status: AC
  Administered 2013-11-29: 200 mg via ORAL
  Filled 2013-11-29: qty 2

## 2013-11-29 MED ORDER — BACITRACIN ZINC 500 UNIT/GM EX OINT
TOPICAL_OINTMENT | CUTANEOUS | Status: AC
  Administered 2013-11-29: 03:00:00 via TOPICAL
  Filled 2013-11-29: qty 0.9

## 2013-11-29 MED ORDER — LIDOCAINE HCL (PF) 1 % IJ SOLN
INTRAMUSCULAR | Status: AC
  Filled 2013-11-29: qty 5

## 2013-11-29 NOTE — ED Provider Notes (Addendum)
CSN: [id number]     Arrival date & time 11/29/13  0119 History   First MD Initiated Contact with Patient 11/29/13 0207     Chief Complaint  Patient presents with  . Tick Bite      (Consider location/radiation/quality/duration/timing/severity/associated sxs/prior Treatment) HPI This is a 33 year old male who felt a small lump in his pubic region yesterday evening about 9:30 PM. He found this to be a "large" tick which he pulled off but thinks the head may still remain. There is a small ring of redness around the bite and he is concerned about infection. He denies any systemic symptoms.  Past Medical History  Diagnosis Date  . Back pain    Past Surgical History  Procedure Laterality Date  . Adenoidectomy    . Tympanostomy tube placement    . Forearm surgery      left-compound fracture   No family history on file. History  Substance Use Topics  . Smoking status: Current Every Day Smoker -- 1.00 packs/day for 15 years    Types: Cigarettes  . Smokeless tobacco: Never Used  . Alcohol Use: 7.2 oz/week    12 Cans of beer per week    Review of Systems  All other systems reviewed and are negative.   Allergies  Codeine  Home Medications   Current Outpatient Rx  Name  Route  Sig  Dispense  Refill  . HYDROcodone-acetaminophen (NORCO/VICODIN) 5-325 MG per tablet      Take one-two tabs po q 4-6 hrs prn pain   15 tablet   0   . ibuprofen (ADVIL,MOTRIN) 200 MG tablet   Oral   Take 600 mg by mouth every 6 (six) hours as needed. pain         . ibuprofen (ADVIL,MOTRIN) 800 MG tablet   Oral   Take 1 tablet (800 mg total) by mouth 3 (three) times daily.   21 tablet   0    BP 100/65  Pulse 67  Temp(Src) 98.2 F (36.8 C) (Oral)  Resp 16  Ht 6\' 1"  (1.854 m)  Wt 194 lb (87.998 kg)  BMI 25.60 kg/m2  SpO2 100%  Physical Exam General: Well-developed, well-nourished male in no acute distress; appearance consistent with age of record HENT: normocephalic; atraumatic Eyes:  Normal appearance Neck: supple Heart: regular rate and rhythm Lungs: Normal respiratory effort and excursion Abdomen: soft; nondistended Extremities: No deformity; full range of motion Neurologic: Awake, alert and oriented; motor function intact in all extremities and symmetric; no facial droop Skin: Warm and dry; approximately 8mm diameter erythematous macule in right pubic region with central punctum Psychiatric: Normal mood and affect    ED Course  Procedures (including critical care time)  The tick bite was anesthetized with approximately 0.5 mL of 1% lidocaine without epinephrine. The center was then explored with 18-gauge needle and a small piece of black foreign material consistent with a tick head was removed. The patient tolerated this well and there were no immediate complications.  MDM   Doxycycline 200 mg given for tick born disease prophylaxis.    Hanley SeamenJohn L Keshaun Dubey, MD 11/29/13 0221  Hanley SeamenJohn L Marceil Welp, MD 11/29/13 [id number]

## 2013-11-29 NOTE — ED Notes (Signed)
Pt states he pulled a tick off of his groin area today and thinks there is a piece left in.

## 2013-12-16 ENCOUNTER — Encounter: Payer: Self-pay | Admitting: Orthopedic Surgery

## 2013-12-16 ENCOUNTER — Ambulatory Visit: Payer: Self-pay | Admitting: Orthopedic Surgery

## 2017-09-20 ENCOUNTER — Ambulatory Visit (INDEPENDENT_AMBULATORY_CARE_PROVIDER_SITE_OTHER): Payer: Worker's Compensation

## 2017-09-20 ENCOUNTER — Encounter: Payer: Self-pay | Admitting: Pediatrics

## 2017-09-20 ENCOUNTER — Ambulatory Visit (INDEPENDENT_AMBULATORY_CARE_PROVIDER_SITE_OTHER): Payer: Worker's Compensation | Admitting: Pediatrics

## 2017-09-20 VITALS — BP 119/81 | HR 88 | Temp 98.0°F | Ht 73.0 in | Wt 211.0 lb

## 2017-09-20 DIAGNOSIS — S143XXA Injury of brachial plexus, initial encounter: Secondary | ICD-10-CM

## 2017-09-20 DIAGNOSIS — S4992XA Unspecified injury of left shoulder and upper arm, initial encounter: Secondary | ICD-10-CM

## 2017-09-20 NOTE — Progress Notes (Signed)
  Subjective:   Patient ID: Dillon Brown, male    DOB: 05/07/1981, 37 y.o.   MRN: [id number] CC: WC injury (L arm injury, injured today, [employer])  HPI: Dillon BracketMichael Equihua is a 37 y.o. 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  Has fallen on the left shoulder in the distant past, has broken his left wrist in the past but no trouble or pain with his left arm, has been able to use the arm normally and without restriction prior to this incident.  ROS: Per HPI   Objective:    BP 119/81 (BP Location: Right Arm, Patient Position: Sitting, Cuff Size: Normal)   Pulse 88   Temp 98 F (36.7 C) (Oral)   Ht 6\' 1"  (1.854 m)   Wt 211 lb (95.7 kg)   BMI 27.84 kg/m   Wt Readings from Last 3 Encounters:  09/20/17 211 lb (95.7 kg)  11/29/13 194 lb (88 kg)  11/06/13 200 lb (90.7 kg)    Gen: NAD, alert, cooperative with exam, NCAT EYES: EOMI, no conjunctival injection, or no icterus CV: NRRR, normal S1/S2, no murmur, distal pulses 2+ b/l Resp: CTABL, no wheezes, normal WOB Abd: +BS, soft, NTND. no guarding or organomegaly Ext: No edema, warm Neuro: Alert and oriented, hand grip 5/5 b/l, dulled sensation across L fingertips compared to R arm MSK: shoulders appear symmetric Some soft tissue swelling anterior and posterior distal upper arm Some soreness with L elbow ROM but ROM  normal, L shoulder ROM decreased internal rotation due to pain in anterior shoulder TTP along soft tissue swelling upper arm, medial and lateral epicondyles of elbow Pain with biceps flex against resistance L arm Rotator cuff muscles intact b/l Normal ROM neck Skin: anterior distal upper arm with apprx 10 cm of redness, superficial abrasion present, no blistering or skin loss  Assessment & Plan:  Casimiro NeedleMichael was seen today for wc injury.  Diagnoses and all orders for this visit:  Arm injuries, left, initial encounter No acute fracture seen, will follow up radiology read -     DG Humerus Left; Future -     DG Elbow 2 Views Left; Future  Injury of left upper extremity, initial encounter rec ibuprofen, rest, gentle ROM Minimal use of L arm until symptoms improve follow upin 1 week No lifting, pushing, pulling with L arm until cleared If not improving will need to see orthopedic surgery  Brachial plexus injury, initial encounter Suspect stretch of nerve cause of tingling in fingers, should improve over next 24h  Follow up plan: 1 week, sooner prn Needs to be seen for worsening swelling, pain, numbness, change in urine color, other symptoms Rex Krasarol Edrian Melucci, MD [facility]

## 2017-09-27 ENCOUNTER — Other Ambulatory Visit: Payer: Self-pay | Admitting: Pediatrics

## 2019-03-24 ENCOUNTER — Ambulatory Visit (HOSPITAL_COMMUNITY)
Admission: RE | Admit: 2019-03-24 | Discharge: 2019-03-24 | Disposition: A | Discharge status: Home / Self Care | Payer: Self-pay | Attending: Psychiatry | Admitting: Psychiatry

## 2019-06-26 ENCOUNTER — Ambulatory Visit (HOSPITAL_COMMUNITY)
Admission: RE | Admit: 2019-06-26 | Discharge: 2019-06-26 | Disposition: A | Discharge status: Home / Self Care | Payer: Self-pay | Attending: Psychiatry | Admitting: Psychiatry

## 2019-06-26 DIAGNOSIS — F329 Major depressive disorder, single episode, unspecified: Secondary | ICD-10-CM | POA: Insufficient documentation

## 2019-06-26 DIAGNOSIS — F1020 Alcohol dependence, uncomplicated: Secondary | ICD-10-CM | POA: Insufficient documentation

## 2019-06-26 NOTE — H&P (Signed)
Behavioral Health Medical Screening Exam  Dillon Brown is an 38 y.o. male.who presents to [facility] as a walk-in, voluntarily, accompanied by his father. Patient reports he os here because his father told him to come. Reports his father was concerned because he has been overwhelmed and stressed lately after loosing his job due to [place] and his truck recently breaking down. He acknowledges that this has caused some depression although he denies any SI, HI or AVH.  He denies history of SA, self-harming behaviors, or inpatient/outpatient psychiatric care. Denies any legal issues or violent behaviors. He reports sleeping 6-8 hours per night and reports recent improvement in appetite. He reports he lives with his wife.He reports smoking mariajuana and drinking alcohol occassionally although denies other substance abuse or use. He reports there is a gun in the home although it is locked away.   He provided verbal consent to speak with his father, Marlou Sa, [phone]. 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 Reports patient has stated that he felt paranoid at night thinking that his wife is going to do something to him. He reports that patient stated he has only used a few time however, he does not believe him. Reports he has told patient several times to leave his wife because of their drug use. Reports he is concerned about his drug use. Reports patient lost his job when the pandemic started, was staying with patients father who recently [passed away so they lost the house and is now staying in a hotel, and his truck just broke down. Reports he does not believe that patient would hurt himself or anyone although patient did make the comment several months ago that, " he could not live like this anymore." Reports he has tried several times for patient to get help although patient make excuses. Reports he is tired of helping patient at this point.   Total Time spent with  patient: 30 minutes  Psychiatric Specialty Exam: Physical Exam  Nursing note and vitals reviewed. Constitutional: He is oriented to person, place, and time.  Neurological: He is alert and oriented to person, place, and time.    Review of Systems  Psychiatric/Behavioral: Positive for depression and substance abuse.  All other systems reviewed and are negative.   There were no vitals taken for this visit.There is no height or weight on file to calculate BMI.  General Appearance: Fairly Groomed  Eye Contact:  Fair  Speech:  Clear and Coherent and Normal Rate  Volume:  Normal  Mood:  Anxious and Depressed  Affect:  anxious   Thought Process:  Coherent, Linear and Descriptions of Associations: Intact  Orientation:  Full (Time, Place, and Person)  Thought Content:  Logical  Suicidal Thoughts:  No  Homicidal Thoughts:  No  Memory:  Immediate;   Fair Recent;   Fair  Judgement:  Impaired  Insight:  Lacking  Psychomotor Activity:  Normal  Concentration: Concentration: Fair and Attention Span: Fair  Recall:  [employer] of Knowledge:Fair  Language: Good  Akathisia:  Negative  Handed:  Right  AIMS (if indicated):     Assets:  Communication Skills Social Support  Sleep:       Musculoskeletal: Strength & Muscle Tone: within normal limits Gait & Station: normal Patient leans: N/A  There were no vitals taken for this visit.  Recommendations:  Based on my evaluation the patient does not appear to have an emergency medical condition.  No evidence of imminent  risk to self or others at present.   Patient does not meet criteria for psychiatric inpatient admission. Resources were provided for housing, substance abuse treatment and outpatient therapy. Patient was strongly encouraged to follow-up with resources. Patient stated there was a gun in the home although he is living in a hotel. He was still educated about safety and advised to remove/lock any firearms, medications or dangerous  products from the home. Father educated on this as well and advised that  If the patient's symptoms worsen or do not continue to improve or if the patient becomes actively suicidal or homicidal then it is recommended that the patient return to the closest hospital emergency room or call 911 for further evaluation and treatment.  National Suicide Prevention Lifeline 1800-SUICIDE or [phone].      Denzil Magnuson, NP 06/26/2019, 4:40 PM

## 2019-06-26 NOTE — BH Assessment (Signed)
Assessment Note  Dillon Brown is a 38 y.o. [facility] who was encouraged to be evaluated by his father due to stress.  Pt states, "I loss my job due to COVID and I been stressed ever since.  My dad brought me over to be evaluated. I guess I'm still depressed but nothing serious."  Pt denies having suicidal thoughts or intentions.  Pt states, "I'm not going to hurt myself or take my life.  I wouldn't do that to my family or myself."  Pt admits alcohol use. Pt states, "I drink a few 6 packs not too often".  Pt could not recall the last time.  Pt denies any other substance use to clinician.  Pt denies HI/A/V-hallucinations.  Pt resides with his wife.  Pt has a 39 y/o son.  Pt denies having a history of inpatient/outpatient MH/SA treatment.  Pt denies having a history of physical, sexual and verbal abuse.  Patient was wearing casual clothes and appeared appropriately groomed.  Pt was alert throughout the assessment.  Patient made fair eye contact and had normal psychomotor activity.  Patient spoke in a normal voice without pressured speech.  Pt expressed feeling fine.  Pt's affect appeared euthymic and congruent with stated mood. Pt's thought process was coherent and logical.  Pt presented with partial insight and judgement.  Pt did not appear to be responding to internal stimuli.  Pt was able to reliable contract for safety.  [facility] with [facility] Provider, Denzil MagnusonLaShunda Thomas, NP who recommends discharge and follow up with outpatient community SA resources.  [facility].  Diagnosis:    F10.20 Alcohol Use Disorder, Moderate  Past Medical History: No past medical history on file.  The histories are not reviewed yet. Please review them in the "History" navigator section and refresh this SmartLink.  Family History: No family history on file.  Social History:  has no history on file for tobacco, alcohol, and drug.  Additional  Social History:  Alcohol / Drug Use Pain Medications: See MARs Prescriptions: See MARs Over the Counter: See MARs History of alcohol / drug use?: Yes Substance #1 Name of Substance 1: Alcohol 1 - Age of First Use: unknown 1 - Amount (size/oz): "a few 6 packs" 1 - Frequency: no too often 1 - Duration: ongoing 1 - Last Use / Amount: unknown  CIWA:   COWS:    Allergies: Not on File  Home Medications: (Not in a hospital admission)   OB/GYN Status:  No LMP for male patient.  General Assessment Data [facility] Assessment: In system Is this a Tele or Face-to-Face Assessment?: Face-to-Face Is this an Initial Assessment or a Re-assessment for this encounter?: Initial Assessment Patient Accompanied by:: N/A Language Other than English: No Living Arrangements: Homeless/Shelter(live in hotel with his wife) What gender do you identify as?: Male Marital status: Married Living Arrangements: Spouse/significant other, Other (Comment)(live in a hotel room) Can pt return to current living arrangement?: Yes Admission Status: Voluntary Is patient capable of signing voluntary admission?: Yes Referral Source: Self/Family/Friend  Medical Screening Exam [facility] Walk[facility]) Medical Exam completed: Yes  Crisis Care Plan Living Arrangements: Spouse/significant other, Other (Comment)(live in a hotel room) Name of Psychiatrist: NA Name of Therapist: NA  Education Status Is patient currently in school?: No Is the patient employed, unemployed or receiving disability?: Unemployed  Risk to self with the past 6 months Suicidal Ideation: No Has patient been a  risk to self within the past 6 months prior to admission? : No Suicidal Intent: No(I wouldn't do that to my family) Has patient had any suicidal intent within the past 6 months prior to admission? : No Is patient at risk for suicide?: No Suicidal Plan?: No Has patient had any suicidal plan within the  past 6 months prior to admission? : No Access to Means: No Previous Attempts/Gestures: No Triggers for Past Attempts: None known Intentional Self Injurious Behavior: None Family Suicide History: No Recent stressful life event(s): Job Loss(loss job during COVID-19) Persecutory voices/beliefs?: No Depression: Yes Depression Symptoms: Loss of interest in usual pleasures, Feeling worthless/self pity Substance abuse history and/or treatment for substance abuse?: No Suicide prevention information given to non-admitted patients: Not applicable  Risk to Others within the past 6 months Homicidal Ideation: No Does patient have any lifetime risk of violence toward others beyond the six months prior to admission? : No Thoughts of Harm to Others: No Current Homicidal Intent: No Current Homicidal Plan: No Access to Homicidal Means: No History of harm to others?: No Assessment of Violence: None Noted Does patient have access to weapons?: No Criminal Charges Pending?: No Does patient have a court date: No Is patient on probation?: No  Psychosis Hallucinations: None noted Delusions: None noted  Mental Status Report Appearance/Hygiene: Unremarkable Eye Contact: Fair Motor Activity: Freedom of movement Speech: Logical/coherent Level of Consciousness: Alert Mood: Pleasant Affect: Appropriate to circumstance Anxiety Level: None Thought Processes: Coherent, Relevant Judgement: Partial Orientation: Person, Place, Appropriate for developmental age Obsessive Compulsive Thoughts/Behaviors: None  Cognitive Functioning Concentration: Normal Memory: Recent Intact, Remote Intact Is patient IDD: No Insight: Fair Impulse Control: Fair Appetite: Fair Have you had any weight changes? : No Change Sleep: No Change Total Hours of Sleep: 6 Vegetative Symptoms: None  ADLScreening [facility] Assessment Services) Patient's cognitive ability adequate to safely complete daily activities?: Yes Patient able  to express need for assistance with ADLs?: Yes Independently performs ADLs?: Yes (appropriate for developmental age)  Prior Inpatient Therapy Prior Inpatient Therapy: No  Prior Outpatient Therapy Prior Outpatient Therapy: No Does patient have an ACCT team?: No Does patient have Intensive In-House Services?  : No Does patient have [facility] services? : No Does patient have P4CC services?: No  ADL Screening (condition at time of admission) Patient's cognitive ability adequate to safely complete daily activities?: Yes Is the patient deaf or have difficulty hearing?: No Does the patient have difficulty seeing, even when wearing glasses/contacts?: No Does the patient have difficulty concentrating, remembering, or making decisions?: No Patient able to express need for assistance with ADLs?: Yes Does the patient have difficulty dressing or bathing?: No Independently performs ADLs?: Yes (appropriate for developmental age) Does the patient have difficulty walking or climbing stairs?: No Weakness of Legs: None Weakness of Arms/Hands: None  Home Assistive Devices/Equipment Home Assistive Devices/Equipment: None    Abuse/Neglect Assessment (Assessment to be complete while patient is alone) Abuse/Neglect Assessment Can Be Completed: Yes Physical Abuse: Denies Verbal Abuse: Denies Sexual Abuse: Denies Exploitation of patient/patient's resources: Denies Self-Neglect: Denies     Regulatory affairs officer (For Healthcare) Does Patient Have a Medical Advance Directive?: No Would patient like information on creating a medical advance directive?: No - Patient declined Nutrition Screen- [place] Adult/WL/AP Patient's home diet: NPO        Disposition: [facility] discussed case with [facility] Provider, Mordecai Maes, NP who recommends discharge and follow up with outpatient community SA resources.  [facility] provided pt with outpatient community SA resources.  Disposition Initial Assessment Completed for this  Encounter: Yes Disposition of Patient: Discharge(Per Denzil Magnuson, NP) Patient refused recommended treatment: No Mode of transportation if patient is discharged/movement?: Car Patient referred to: Outpatient clinic referral  On Site Evaluation by:  Chace Klippel L. Lorain Fettes, MS, [facility], [facility] Reviewed with Physician:  Denzil Magnuson, NP  Chanequa Spees L Omnia Dollinger. MS, [facility], [facility] 06/26/2019 5:28 PM

## 2019-08-07 ENCOUNTER — Emergency Department (HOSPITAL_COMMUNITY): Admission: EM | Admit: 2019-08-07 | Discharge: 2019-08-07 | Discharge status: Against Medical Advice | Payer: Self-pay

## 2019-08-07 ENCOUNTER — Other Ambulatory Visit: Payer: Self-pay

## 2019-09-15 ENCOUNTER — Ambulatory Visit (HOSPITAL_COMMUNITY)
Admission: RE | Admit: 2019-09-15 | Discharge: 2019-09-15 | Disposition: A | Discharge status: Home / Self Care | Payer: Self-pay | Attending: Psychiatry | Admitting: Psychiatry

## 2022-08-04 DIAGNOSIS — Z419 Encounter for procedure for purposes other than remedying health state, unspecified: Secondary | ICD-10-CM | POA: Diagnosis not present

## 2022-09-04 DIAGNOSIS — Z419 Encounter for procedure for purposes other than remedying health state, unspecified: Secondary | ICD-10-CM | POA: Diagnosis not present

## 2022-10-05 DIAGNOSIS — Z419 Encounter for procedure for purposes other than remedying health state, unspecified: Secondary | ICD-10-CM | POA: Diagnosis not present

## 2022-11-03 DIAGNOSIS — Z419 Encounter for procedure for purposes other than remedying health state, unspecified: Secondary | ICD-10-CM | POA: Diagnosis not present

## 2022-12-04 DIAGNOSIS — Z419 Encounter for procedure for purposes other than remedying health state, unspecified: Secondary | ICD-10-CM | POA: Diagnosis not present

## 2023-01-03 DIAGNOSIS — Z419 Encounter for procedure for purposes other than remedying health state, unspecified: Secondary | ICD-10-CM | POA: Diagnosis not present

## 2023-02-03 DIAGNOSIS — Z419 Encounter for procedure for purposes other than remedying health state, unspecified: Secondary | ICD-10-CM | POA: Diagnosis not present

## 2023-03-05 DIAGNOSIS — Z419 Encounter for procedure for purposes other than remedying health state, unspecified: Secondary | ICD-10-CM | POA: Diagnosis not present

## 2023-04-05 DIAGNOSIS — Z419 Encounter for procedure for purposes other than remedying health state, unspecified: Secondary | ICD-10-CM | POA: Diagnosis not present

## 2023-05-06 DIAGNOSIS — Z419 Encounter for procedure for purposes other than remedying health state, unspecified: Secondary | ICD-10-CM | POA: Diagnosis not present

## 2023-06-05 DIAGNOSIS — Z419 Encounter for procedure for purposes other than remedying health state, unspecified: Secondary | ICD-10-CM | POA: Diagnosis not present

## 2023-07-06 DIAGNOSIS — Z419 Encounter for procedure for purposes other than remedying health state, unspecified: Secondary | ICD-10-CM | POA: Diagnosis not present

## 2023-08-05 DIAGNOSIS — Z419 Encounter for procedure for purposes other than remedying health state, unspecified: Secondary | ICD-10-CM | POA: Diagnosis not present

## 2023-08-17 ENCOUNTER — Emergency Department (HOSPITAL_COMMUNITY): Payer: Medicaid Other

## 2023-08-17 ENCOUNTER — Other Ambulatory Visit: Payer: Self-pay

## 2023-08-17 ENCOUNTER — Encounter (HOSPITAL_COMMUNITY): Payer: Self-pay | Admitting: Emergency Medicine

## 2023-08-17 ENCOUNTER — Emergency Department (HOSPITAL_COMMUNITY)
Admission: EM | Admit: 2023-08-17 | Discharge: 2023-08-17 | Disposition: A | Discharge status: Home / Self Care | Payer: Medicaid Other | Attending: Emergency Medicine | Admitting: Emergency Medicine

## 2023-08-17 DIAGNOSIS — F17200 Nicotine dependence, unspecified, uncomplicated: Secondary | ICD-10-CM | POA: Insufficient documentation

## 2023-08-17 DIAGNOSIS — Z743 Need for continuous supervision: Secondary | ICD-10-CM | POA: Diagnosis not present

## 2023-08-17 DIAGNOSIS — R079 Chest pain, unspecified: Secondary | ICD-10-CM

## 2023-08-17 DIAGNOSIS — R1084 Generalized abdominal pain: Secondary | ICD-10-CM | POA: Diagnosis not present

## 2023-08-17 DIAGNOSIS — R0689 Other abnormalities of breathing: Secondary | ICD-10-CM | POA: Diagnosis not present

## 2023-08-17 DIAGNOSIS — R0789 Other chest pain: Secondary | ICD-10-CM | POA: Insufficient documentation

## 2023-08-17 DIAGNOSIS — R1013 Epigastric pain: Secondary | ICD-10-CM | POA: Diagnosis not present

## 2023-08-17 DIAGNOSIS — R7401 Elevation of levels of liver transaminase levels: Secondary | ICD-10-CM | POA: Diagnosis not present

## 2023-08-17 DIAGNOSIS — R42 Dizziness and giddiness: Secondary | ICD-10-CM | POA: Diagnosis not present

## 2023-08-17 LAB — CBC
HCT: 41.4 % (ref 39.0–52.0)
Hemoglobin: 14.5 g/dL (ref 13.0–17.0)
MCH: 32.7 pg (ref 26.0–34.0)
MCHC: 35 g/dL (ref 30.0–36.0)
MCV: 93.5 fL (ref 80.0–100.0)
Platelets: 213 10*3/uL (ref 150–400)
RBC: 4.43 MIL/uL (ref 4.22–5.81)
RDW: 12.3 % (ref 11.5–15.5)
WBC: 11.8 10*3/uL — ABNORMAL HIGH (ref 4.0–10.5)
nRBC: 0 % (ref 0.0–0.2)

## 2023-08-17 LAB — HEPATIC FUNCTION PANEL
ALT: 33 U/L (ref 0–44)
AST: 70 U/L — ABNORMAL HIGH (ref 15–41)
Albumin: 3.5 g/dL (ref 3.5–5.0)
Alkaline Phosphatase: 79 U/L (ref 38–126)
Bilirubin, Direct: 0.1 mg/dL (ref 0.0–0.2)
Indirect Bilirubin: 0.5 mg/dL (ref 0.3–0.9)
Total Bilirubin: 0.6 mg/dL (ref <1.2)
Total Protein: 6.1 g/dL — ABNORMAL LOW (ref 6.5–8.1)

## 2023-08-17 LAB — BASIC METABOLIC PANEL
Anion gap: 10 (ref 5–15)
BUN: 12 mg/dL (ref 6–20)
CO2: 25 mmol/L (ref 22–32)
Calcium: 8.8 mg/dL — ABNORMAL LOW (ref 8.9–10.3)
Chloride: 103 mmol/L (ref 98–111)
Creatinine, Ser: 1.12 mg/dL (ref 0.61–1.24)
GFR, Estimated: >60 mL/min (ref >60)
Glucose, Bld: 174 mg/dL — ABNORMAL HIGH (ref 70–99)
Potassium: 3.3 mmol/L — ABNORMAL LOW (ref 3.5–5.1)
Sodium: 138 mmol/L (ref 135–145)

## 2023-08-17 LAB — CBG MONITORING, ED: Glucose-Capillary: 190 mg/dL — ABNORMAL HIGH (ref 70–99)

## 2023-08-17 LAB — D-DIMER, QUANTITATIVE: D-Dimer, Quant: <0.27 ug{FEU}/mL (ref 0.00–0.50)

## 2023-08-17 LAB — TROPONIN I (HIGH SENSITIVITY)
Troponin I (High Sensitivity): 4 ng/L (ref <18)
Troponin I (High Sensitivity): 4 ng/L (ref <18)

## 2023-08-17 LAB — LIPASE, BLOOD: Lipase: 27 U/L (ref 11–51)

## 2023-08-17 MED ORDER — FAMOTIDINE 20 MG PO TABS
20.0000 mg | ORAL_TABLET | Freq: Once | ORAL | Status: AC
  Administered 2023-08-17: 20 mg via ORAL
  Filled 2023-08-17: qty 1

## 2023-08-17 MED ORDER — LIDOCAINE VISCOUS HCL 2 % MT SOLN
15.0000 mL | Freq: Once | OROMUCOSAL | Status: AC
  Administered 2023-08-17: 15 mL via ORAL
  Filled 2023-08-17: qty 15

## 2023-08-17 MED ORDER — NITROGLYCERIN 0.4 MG SL SUBL
0.4000 mg | SUBLINGUAL_TABLET | SUBLINGUAL | Status: AC | PRN
Start: 2023-08-17 — End: 2023-08-17
  Administered 2023-08-17 (×3): 0.4 mg via SUBLINGUAL
  Filled 2023-08-17: qty 1

## 2023-08-17 MED ORDER — ALUM & MAG HYDROXIDE-SIMETH 200-200-20 MG/5ML PO SUSP
30.0000 mL | Freq: Once | ORAL | Status: AC
  Administered 2023-08-17: 30 mL via ORAL
  Filled 2023-08-17: qty 30

## 2023-08-17 NOTE — ED Provider Notes (Signed)
[place] EMERGENCY DEPARTMENT AT [facility] - Inpatient Provider Note   CSN: [id number] Arrival date & time: 08/17/23  0236     History  Chief Complaint  Patient presents with   Chest Pain    Dillon Brown is a 42 y.o. male.  42 year old male that presents ER today secondary to epigastric pain radiated to his chest.  Some sharp component and some pressure component.  Sometimes it feels like this to hands of the side of his chest squeezing his chest together.  No nausea or vomiting.  No diaphoresis or shortness of breath but was a bit lightheaded when happened.  It woke him up out of sleep.  He had eaten approximately about an hour before sleep.  States he feels a little bit better now wonders if he just overreacted.  No history of heart disease, no family history of early heart disease.  He does smoke and drink alcohol daily.  Slightly overweight.  No other known medical problems.   Chest Pain      Home Medications Prior to Admission medications   Medication Sig Start Date End Date Taking? Authorizing Provider  ibuprofen (ADVIL,MOTRIN) 200 MG tablet Take 600 mg by mouth every 6 (six) hours as needed. pain    [provider]      Allergies    Codeine    Review of Systems   Review of Systems  Cardiovascular:  Positive for chest pain.    Physical Exam Updated Vital Signs BP (!) 133/93   Pulse 86   Temp 98.3 F (36.8 C) (Oral)   Resp 20   SpO2 93%  Physical Exam Vitals and nursing note reviewed.  Constitutional:      Appearance: He is well-developed.  HENT:     Head: Normocephalic and atraumatic.  Cardiovascular:     Rate and Rhythm: Normal rate.  Pulmonary:     Effort: Pulmonary effort is normal. No respiratory distress.  Abdominal:     General: There is no distension.  Musculoskeletal:        General: Normal range of motion.     Cervical back: Normal range of motion.  Skin:    General: Skin is warm and dry.  Neurological:     Mental Status:  He is alert.     ED Results / Procedures / Treatments   Labs (all labs ordered are listed, but only abnormal results are displayed) Labs Reviewed  BASIC METABOLIC PANEL - Abnormal; Notable for the following components:      Result Value   Potassium 3.3 (*)    Glucose, Bld 174 (*)    Calcium 8.8 (*)    All other components within normal limits  CBC - Abnormal; Notable for the following components:   WBC 11.8 (*)    All other components within normal limits  HEPATIC FUNCTION PANEL - Abnormal; Notable for the following components:   Total Protein 6.1 (*)    AST 70 (*)    All other components within normal limits  CBG MONITORING, ED - Abnormal; Notable for the following components:   Glucose-Capillary 190 (*)    All other components within normal limits  LIPASE, BLOOD  D-DIMER, QUANTITATIVE  TROPONIN I (HIGH SENSITIVITY)  TROPONIN I (HIGH SENSITIVITY)    EKG EKG Interpretation Date/Time:  Friday August 17 2023 02:39:46 EST Ventricular Rate:  72 PR Interval:  142 QRS Duration:  95 QT Interval:  359 QTC Calculation: 393 R Axis:   76  Text Interpretation: Sinus rhythm  No significant change since last tracing Confirmed by Marily Memos [phone]) on 08/17/2023 2:51:40 AM  Radiology DG Chest 2 View Result Date: 08/17/2023 CLINICAL DATA:  Chest pain EXAM: CHEST - 2 VIEW COMPARISON:  08/01/2011 FINDINGS: The heart size and mediastinal contours are within normal limits. Both lungs are clear. The visualized skeletal structures are unremarkable. IMPRESSION: Normal study. Electronically Signed   By: Charlett Nose M.D.   On: 08/17/2023 03:06    Procedures Procedures    Medications Ordered in ED Medications  famotidine (PEPCID) tablet 20 mg (20 mg Oral Given 08/17/23 0303)  alum & mag hydroxide-simeth (MAALOX/MYLANTA) 200-200-20 MG/5ML suspension 30 mL (30 mLs Oral Given 08/17/23 0303)    And  lidocaine (XYLOCAINE) 2 % viscous mouth solution 15 mL (15 mLs Oral Given 08/17/23  0303)  nitroGLYCERIN (NITROSTAT) SL tablet 0.4 mg (0.4 mg Sublingual Given 08/17/23 4782)    ED Course/ Medical Decision Making/ A&P                                 Medical Decision Making Amount and/or Complexity of Data Reviewed Labs: ordered. Radiology: ordered.  Risk OTC drugs. Prescription drug management.   Slightly low potassium but unlikely to be symptomatic.  EKG and troponins are negative making ACS unlikely.  D-dimer negative and no other evidence of PE making that unlikely.  No evidence of heart failure.  Could be gastric ulcer versus indigestion/heartburn/gastritis.  Less likely bowel obstruction or other abdominal pathology without other GI symptoms.  Will refer to cardiology for stress test.  Will return here for any new or worsening symptoms.  Chest pain-free and symptom-free at time of discharge.  Sleeping comfortably after medications here.   Final Clinical Impression(s) / ED Diagnoses Final diagnoses:  Nonspecific chest pain  Elevated AST (SGOT)    Rx / DC Orders ED Discharge Orders          Ordered    Ambulatory referral to Cardiology        08/17/23 0607              Jahking Lesser, Barbara Cower, MD 08/17/23 [phone]

## 2023-08-17 NOTE — ED Triage Notes (Signed)
Pt BIBA per ems: pt coming from home w/ c/o sudden onset chest pain upon lying down for bed at 12am. Pt reports central chest/abd pain. 324 ASA PTA. Reports nausea denies vomiting. 18GLAC. 200cc fluids given en route.

## 2023-09-05 DIAGNOSIS — Z419 Encounter for procedure for purposes other than remedying health state, unspecified: Secondary | ICD-10-CM | POA: Diagnosis not present

## 2023-10-06 DIAGNOSIS — Z419 Encounter for procedure for purposes other than remedying health state, unspecified: Secondary | ICD-10-CM | POA: Diagnosis not present

## 2023-11-03 DIAGNOSIS — Z419 Encounter for procedure for purposes other than remedying health state, unspecified: Secondary | ICD-10-CM | POA: Diagnosis not present

## 2023-12-11 ENCOUNTER — Ambulatory Visit: Payer: Medicaid Other | Attending: Cardiology | Admitting: Cardiology

## 2023-12-11 ENCOUNTER — Encounter: Payer: Self-pay | Admitting: Cardiology

## 2023-12-11 VITALS — BP 98/60 | HR 89 | Ht 73.0 in | Wt 219.0 lb

## 2023-12-11 DIAGNOSIS — Z131 Encounter for screening for diabetes mellitus: Secondary | ICD-10-CM | POA: Diagnosis not present

## 2023-12-11 DIAGNOSIS — R079 Chest pain, unspecified: Secondary | ICD-10-CM

## 2023-12-11 DIAGNOSIS — Z13220 Encounter for screening for lipoid disorders: Secondary | ICD-10-CM

## 2023-12-11 DIAGNOSIS — Z136 Encounter for screening for cardiovascular disorders: Secondary | ICD-10-CM

## 2023-12-11 NOTE — Progress Notes (Signed)
      Clinical Summary Dillon Brown is a 43 y.o.male seen today as a new patient for the following medical problems  1.Chest pain - ER visit 08/2023 with chest pain - trops neg, EKG SR without ischemic changes. Ddimer neg  - some recurrent pains since that time - midchest/epigastric, sharp pain then becomes aching/tightness. 5-6/10 in severity. Tends to occur with activity. For example was using push mower had some exertional pain, would improve with rest. Not positional. Lasts up to 30 minutes. Some mild SOB - episodes occur about 2-3 times a week  - some recent DOE with activities.   CAD risk factors: +tobacco x almost 30 years. Maternal grandfather coronary disease early 58s.  - has not seen doctor regularly, has not had any recent blood work.    Past Medical History:  Diagnosis Date   Back pain      Allergies  Allergen Reactions   Codeine     STATES THAT HE HAS NEVER TAKEN CODEINE DUE TO HEREDITARY ALLERGY. PATIENT IS NOT SURE WHETHER HE IS ALLERGIC TO THIS MEDICATION     Current Outpatient Medications  Medication Sig Dispense Refill   ibuprofen (ADVIL,MOTRIN) 200 MG tablet Take 600 mg by mouth every 6 (six) hours as needed. pain     No current facility-administered medications for this visit.     Past Surgical History:  Procedure Laterality Date   ADENOIDECTOMY     FOREARM SURGERY     left-compound fracture   TYMPANOSTOMY TUBE PLACEMENT       Allergies  Allergen Reactions   Codeine     STATES THAT HE HAS NEVER TAKEN CODEINE DUE TO HEREDITARY ALLERGY. PATIENT IS NOT SURE WHETHER HE IS ALLERGIC TO THIS MEDICATION      No family history on file.   Social History Dillon Brown reports that he has been smoking cigarettes. He has a 15 pack-year smoking history. He has never used smokeless tobacco. Dillon Brown reports current alcohol use of about 12.0 standard drinks of alcohol per week.      Physical Examination Today's Vitals   12/11/23 1045  BP:  98/60  Pulse: 89  SpO2: 98%  Weight: 219 lb (99.3 kg)  Height: 6\' 1"  (1.854 m)   Body mass index is 28.89 kg/m.  Gen: resting comfortably, no acute distress HEENT: no scleral icterus, pupils equal round and reactive, no palptable cervical adenopathy,  CV: RRR, no m/rg, no jvd Resp: Clear to auscultation bilaterally GI: abdomen is soft, non-tender, non-distended, normal bowel sounds, no hepatosplenomegaly MSK: extremities are warm, no edema.  Skin: warm, no rash Neuro:  no focal deficits Psych: appropriate affect     Assessment and Plan   1.Chest pain -some components raise concerns for possible cardiac related chest pain - long term smoker. Has not seen doctors, unknown lipid and DM status. Will send for labs - pending kidney function would plan for coronary CTA - EKG today SR, no acute ischemic changes  Obtain annual labs  F/u pending .     Antoine Poche, M.D.

## 2023-12-11 NOTE — Patient Instructions (Signed)
 Medication Instructions:  Your physician recommends that you continue on your current medications as directed. Please refer to the Current Medication list given to you today.  *If you need a refill on your cardiac medications before your next appointment, please call your pharmacy*  Lab Work: CMET CBC TSH A1C MAG Lipid  If you have labs (blood work) drawn today and your tests are completely normal, you will receive your results only by: MyChart Message (if you have MyChart) OR A paper copy in the mail If you have any lab test that is abnormal or we need to change your treatment, we will call you to review the results.  Testing/Procedures: None  Follow-Up: At [facility], you and your health needs are our priority.  As part of our continuing mission to provide you with exceptional heart care, our providers are all part of one team.  This team includes your primary Cardiologist (physician) and Advanced Practice Providers or APPs (Physician Assistants and Nurse Practitioners) who all work together to provide you with the care you need, when you need it.  Your next appointment:    Follow up to be determined.   Provider:   You may see Dina Rich, MD or one of the following Advanced Practice Providers on your designated Care Team:   Randall An, PA-C  [place], [place] Jacolyn Reedy, [place]     We recommend signing up for the patient portal called "MyChart".  Sign up information is provided on this After Visit Summary.  MyChart is used to connect with patients for Virtual Visits (Telemedicine).  Patients are able to view lab/test results, encounter notes, upcoming appointments, etc.  Non-urgent messages can be sent to your provider as well.   To learn more about what you can do with MyChart, go to [email or web address].   Other Instructions

## 2023-12-15 DIAGNOSIS — Z419 Encounter for procedure for purposes other than remedying health state, unspecified: Secondary | ICD-10-CM | POA: Diagnosis not present

## 2024-01-14 DIAGNOSIS — Z419 Encounter for procedure for purposes other than remedying health state, unspecified: Secondary | ICD-10-CM | POA: Diagnosis not present

## 2024-02-14 DIAGNOSIS — Z419 Encounter for procedure for purposes other than remedying health state, unspecified: Secondary | ICD-10-CM | POA: Diagnosis not present

## 2024-03-15 DIAGNOSIS — Z419 Encounter for procedure for purposes other than remedying health state, unspecified: Secondary | ICD-10-CM | POA: Diagnosis not present

## 2024-03-26 ENCOUNTER — Ambulatory Visit: Payer: Self-pay

## 2024-03-26 NOTE — Telephone Encounter (Signed)
 Duplicate CRM, please see previous NT encounter.

## 2024-03-26 NOTE — Telephone Encounter (Signed)
 Pt advised UC as not established pt at this time, New pt visit was scheduled.   FYI Only or Action Required?: FYI only for provider.  Called Nurse Triage reporting Hand Problem.  Symptoms began several months ago.  Interventions attempted: Nothing.  Symptoms are: gradually worsening.  Triage Disposition: See PCP When Office is Open (Within 3 Days)  Patient/caregiver understands and will follow disposition?: Yes, will follow disposition  Copied from CRM #[id number]. Topic: Clinical - Red Word Triage >> Mar 26, 2024 12:05 PM Turkey B wrote: [facility] that prompted transfer to Nurse Triage: pt left hand shakes and is getting worse >> Mar 26, 2024 12:10 PM Donna E wrote: Call dropped Olam called back  Reason for Disposition  [1] Weakness or numbness in hand or fingers AND [2] present > 2 weeks  Answer Assessment - Initial Assessment Questions 1. ONSET: When did the pain start?     Shaking started intermittently several months ago, wife states that she believes he has pain but doesn't tell her 2. LOCATION: Where is the pain located?     Bilateral hands 3. PAIN: How bad is the pain? (Scale 1-10; or mild, moderate, severe)     Unsure, pt is not with caller 4. WORK OR EXERCISE: Has there been any recent work or exercise that involved this part (i.e., hand or wrist) of the body?     Pt does use hands for work, lawn care 5. CAUSE: What do you think is causing the pain?     Unsure,  6. AGGRAVATING FACTORS: What makes the pain worse? (e.g., using computer)     Denies ETOH abuse,  7. OTHER SYMPTOMS: Do you have any other symptoms? (e.g., fever, neck pain, numbness or tingling, rash, swelling)     Caller states that he has admitted to numbness in his hands.  Protocols used: Hand Pain-A-AH

## 2024-04-15 DIAGNOSIS — Z419 Encounter for procedure for purposes other than remedying health state, unspecified: Secondary | ICD-10-CM | POA: Diagnosis not present

## 2024-05-12 NOTE — Progress Notes (Unsigned)
 New Patient Office Visit  Subjective   Patient ID: Dillon Brown, male    DOB: [birth year]  Age: 43 y.o. MRN: [id number]  CC: No chief complaint on file.   HPI Dillon Brown presents to establish care ***  Outpatient Encounter Medications as of 05/13/2024  Medication Sig   ibuprofen  (ADVIL ,MOTRIN ) 200 MG tablet Take 600 mg by mouth every 6 (six) hours as needed. pain   No facility-administered encounter medications on file as of 05/13/2024.    Past Medical History:  Diagnosis Date   Back pain     Past Surgical History:  Procedure Laterality Date   ADENOIDECTOMY     FOREARM SURGERY     left-compound fracture   TYMPANOSTOMY TUBE PLACEMENT      Family History  Problem Relation Age of Onset   Asthma Mother    Hypertension Mother    Scoliosis Mother     Social History   Socioeconomic History   Marital status: Married    Spouse name: Not on file   Number of children: Not on file   Years of education: Not on file   Highest education level: Not on file  Occupational History   Not on file  Tobacco Use   Smoking status: Every Day    Current packs/day: 1.00    Average packs/day: 1 pack/day for 15.0 years (15.0 ttl pk-yrs)    Types: Cigarettes   Smokeless tobacco: Never  Substance and Sexual Activity   Alcohol use: Yes    Alcohol/week: 12.0 standard drinks of alcohol    Types: 12 Cans of beer per week   Drug use: No   Sexual activity: Yes    Birth control/protection: None  Other Topics Concern   Not on file  Social History Narrative   Not on file   Social Drivers of Health   Financial Resource Strain: Not on file  Food Insecurity: Not on file  Transportation Needs: Not on file  Physical Activity: Not on file  Stress: Not on file  Social Connections: Not on file  Intimate Partner Violence: Not on file    ROS Negative unless indicated in HPI    Objective   There were no vitals taken for this visit.  Physical Exam  Last CBC Lab Results   Component Value Date   WBC 11.8 (H) 08/17/2023   HGB 14.5 08/17/2023   HCT 41.4 08/17/2023   MCV 93.5 08/17/2023   MCH 32.7 08/17/2023   RDW 12.3 08/17/2023   PLT 213 08/17/2023   Last metabolic panel Lab Results  Component Value Date   GLUCOSE 174 (H) 08/17/2023   NA 138 08/17/2023   K 3.3 (L) 08/17/2023   CL 103 08/17/2023   CO2 25 08/17/2023   BUN 12 08/17/2023   CREATININE 1.12 08/17/2023   GFRNONAA >60 08/17/2023   CALCIUM  8.8 (L) 08/17/2023   PROT 6.1 (L) 08/17/2023   ALBUMIN 3.5 08/17/2023   BILITOT 0.6 08/17/2023   ALKPHOS 79 08/17/2023   AST 70 (H) 08/17/2023   ALT 33 08/17/2023   ANIONGAP 10 08/17/2023         Assessment & Plan:  There are no diagnoses linked to this encounter. Continue healthy lifestyle choices, including diet (rich in fruits, vegetables, and lean proteins, and low in salt and simple carbohydrates) and exercise (at least 30 minutes of moderate physical activity daily).     The above assessment and management plan was discussed with the patient. The patient verbalized understanding of and has  agreed to the management plan. Patient is aware to call the clinic if they develop any new symptoms or if symptoms persist or worsen. Patient is aware when to return to the clinic for a follow-up visit. Patient educated on when it is appropriate to go to the emergency department.  No follow-ups on file.   Briawna Carver [place] Thompson, DNP [facility] [address] [place], [place] [address] [phone]  Note: This document was prepared by [employer] voice dictation technology and any errors that results from this process are unintentional.

## 2024-05-13 ENCOUNTER — Ambulatory Visit: Payer: Self-pay | Admitting: Nurse Practitioner

## 2024-05-13 ENCOUNTER — Ambulatory Visit (INDEPENDENT_AMBULATORY_CARE_PROVIDER_SITE_OTHER)

## 2024-05-13 ENCOUNTER — Encounter: Payer: Self-pay | Admitting: Nurse Practitioner

## 2024-05-13 VITALS — BP 110/74 | HR 86 | Temp 97.8°F | Ht 73.0 in | Wt 212.6 lb

## 2024-05-13 DIAGNOSIS — Z0001 Encounter for general adult medical examination with abnormal findings: Secondary | ICD-10-CM | POA: Insufficient documentation

## 2024-05-13 DIAGNOSIS — M25512 Pain in left shoulder: Secondary | ICD-10-CM

## 2024-05-13 DIAGNOSIS — Z720 Tobacco use: Secondary | ICD-10-CM | POA: Insufficient documentation

## 2024-05-13 DIAGNOSIS — F411 Generalized anxiety disorder: Secondary | ICD-10-CM | POA: Diagnosis not present

## 2024-05-13 DIAGNOSIS — F332 Major depressive disorder, recurrent severe without psychotic features: Secondary | ICD-10-CM | POA: Diagnosis not present

## 2024-05-13 DIAGNOSIS — Z716 Tobacco abuse counseling: Secondary | ICD-10-CM | POA: Insufficient documentation

## 2024-05-13 DIAGNOSIS — M79622 Pain in left upper arm: Secondary | ICD-10-CM | POA: Diagnosis not present

## 2024-05-13 DIAGNOSIS — M79632 Pain in left forearm: Secondary | ICD-10-CM | POA: Diagnosis not present

## 2024-05-13 DIAGNOSIS — G8929 Other chronic pain: Secondary | ICD-10-CM | POA: Insufficient documentation

## 2024-05-13 DIAGNOSIS — Z6828 Body mass index (BMI) 28.0-28.9, adult: Secondary | ICD-10-CM | POA: Diagnosis not present

## 2024-05-13 DIAGNOSIS — F1010 Alcohol abuse, uncomplicated: Secondary | ICD-10-CM

## 2024-05-13 DIAGNOSIS — M549 Dorsalgia, unspecified: Secondary | ICD-10-CM | POA: Insufficient documentation

## 2024-05-13 DIAGNOSIS — E663 Overweight: Secondary | ICD-10-CM | POA: Diagnosis not present

## 2024-05-13 DIAGNOSIS — F17200 Nicotine dependence, unspecified, uncomplicated: Secondary | ICD-10-CM | POA: Insufficient documentation

## 2024-05-13 LAB — LIPID PANEL

## 2024-05-13 MED ORDER — ESCITALOPRAM OXALATE 10 MG PO TABS: 10.0000 mg | ORAL_TABLET | Freq: Every day | ORAL | 1 refills | Status: DC

## 2024-05-13 MED ORDER — PREDNISONE 10 MG PO TABS: 10.0000 mg | ORAL_TABLET | Freq: Every day | ORAL | 0 refills | Status: DC

## 2024-05-13 MED ORDER — TIZANIDINE HCL 2 MG PO CAPS: 2.0000 mg | ORAL_CAPSULE | Freq: Three times a day (TID) | ORAL | 0 refills | Status: DC | PRN

## 2024-05-14 ENCOUNTER — Ambulatory Visit: Payer: Self-pay | Admitting: Nurse Practitioner

## 2024-05-14 ENCOUNTER — Other Ambulatory Visit: Payer: Self-pay | Admitting: Nurse Practitioner

## 2024-05-14 DIAGNOSIS — E782 Mixed hyperlipidemia: Secondary | ICD-10-CM

## 2024-05-14 DIAGNOSIS — E559 Vitamin D deficiency, unspecified: Secondary | ICD-10-CM

## 2024-05-14 LAB — CBC WITH DIFFERENTIAL/PLATELET
Basophils Absolute: 0.1 x10E3/uL (ref 0.0–0.2)
Basos: 1 %
EOS (ABSOLUTE): 0.5 x10E3/uL — ABNORMAL HIGH (ref 0.0–0.4)
Eos: 6 %
Hematocrit: 47.1 % (ref 37.5–51.0)
Hemoglobin: 16.1 g/dL (ref 13.0–17.7)
Immature Grans (Abs): 0 x10E3/uL (ref 0.0–0.1)
Immature Granulocytes: 0 %
Lymphocytes Absolute: 1.7 x10E3/uL (ref 0.7–3.1)
Lymphs: 22 %
MCH: 31.9 pg (ref 26.6–33.0)
MCHC: 34.2 g/dL (ref 31.5–35.7)
MCV: 94 fL (ref 79–97)
Monocytes Absolute: 0.6 x10E3/uL (ref 0.1–0.9)
Monocytes: 8 %
Neutrophils Absolute: 4.9 x10E3/uL (ref 1.4–7.0)
Neutrophils: 63 %
Platelets: 263 x10E3/uL (ref 150–450)
RBC: 5.04 x10E6/uL (ref 4.14–5.80)
RDW: 12.9 % (ref 11.6–15.4)
WBC: 7.6 x10E3/uL (ref 3.4–10.8)

## 2024-05-14 LAB — CMP14+EGFR
ALT: 16 IU/L (ref 0–44)
AST: 15 IU/L (ref 0–40)
Albumin: 4.5 g/dL (ref 4.1–5.1)
Alkaline Phosphatase: 89 IU/L (ref 44–121)
BUN/Creatinine Ratio: 11 (ref 9–20)
BUN: 11 mg/dL (ref 6–24)
Bilirubin Total: 0.4 mg/dL (ref 0.0–1.2)
CO2: 23 mmol/L (ref 20–29)
Calcium: 9.1 mg/dL (ref 8.7–10.2)
Chloride: 103 mmol/L (ref 96–106)
Creatinine, Ser: 1.03 mg/dL (ref 0.76–1.27)
Globulin, Total: 2.3 g/dL (ref 1.5–4.5)
Glucose: 85 mg/dL (ref 70–99)
Potassium: 4.5 mmol/L (ref 3.5–5.2)
Sodium: 138 mmol/L (ref 134–144)
Total Protein: 6.8 g/dL (ref 6.0–8.5)
eGFR: 92 mL/min/1.73 (ref >59)

## 2024-05-14 LAB — HEPB+HEPC+HIV PANEL
HIV Screen 4th Generation wRfx: NONREACTIVE
Hep B E Ab: NONREACTIVE
Hep B Surface Ab, Qual: NONREACTIVE
Hep C Virus Ab: NONREACTIVE

## 2024-05-14 LAB — LIPID PANEL
Cholesterol, Total: 238 mg/dL — AB (ref 100–199)
HDL: 40 mg/dL (ref >39)
LDL CALC COMMENT:: 6 ratio — AB (ref 0.0–5.0)
LDL Chol Calc (NIH): 164 mg/dL — AB (ref 0–99)
Triglycerides: 186 mg/dL — AB (ref 0–149)
VLDL Cholesterol Cal: 34 mg/dL (ref 5–40)

## 2024-05-14 LAB — THYROID PANEL WITH TSH
Free Thyroxine Index: 2 (ref 1.2–4.9)
T3 Uptake Ratio: 29 (ref 24–39)
T4, Total: 6.9 ug/dL (ref 4.5–12.0)
TSH: 1.38 u[IU]/mL (ref 0.450–4.500)

## 2024-05-14 LAB — VITAMIN D 25 HYDROXY (VIT D DEFICIENCY, FRACTURES): Vit D, 25-Hydroxy: 17.2 ng/mL — AB (ref 30.0–100.0)

## 2024-05-14 MED ORDER — ATORVASTATIN CALCIUM 10 MG PO TABS: 10.0000 mg | ORAL_TABLET | Freq: Every day | ORAL | 1 refills | Status: DC

## 2024-05-14 MED ORDER — VITAMIN D (ERGOCALCIFEROL) 1.25 MG (50000 UNIT) PO CAPS: 50000.0000 [IU] | ORAL_CAPSULE | ORAL | 1 refills | Status: DC

## 2024-05-16 DIAGNOSIS — Z419 Encounter for procedure for purposes other than remedying health state, unspecified: Secondary | ICD-10-CM | POA: Diagnosis not present

## 2024-06-02 ENCOUNTER — Other Ambulatory Visit: Payer: Self-pay | Admitting: Nurse Practitioner

## 2024-06-02 DIAGNOSIS — G8929 Other chronic pain: Secondary | ICD-10-CM

## 2024-06-02 DIAGNOSIS — M79622 Pain in left upper arm: Secondary | ICD-10-CM

## 2024-06-24 ENCOUNTER — Ambulatory Visit: Admitting: Nurse Practitioner

## 2024-06-24 ENCOUNTER — Encounter: Payer: Self-pay | Admitting: Nurse Practitioner

## 2024-06-24 VITALS — BP 111/72 | HR 87 | Temp 97.8°F | Ht 73.0 in | Wt 208.4 lb

## 2024-06-24 DIAGNOSIS — R4586 Emotional lability: Secondary | ICD-10-CM

## 2024-06-24 DIAGNOSIS — F332 Major depressive disorder, recurrent severe without psychotic features: Secondary | ICD-10-CM

## 2024-06-24 DIAGNOSIS — F411 Generalized anxiety disorder: Secondary | ICD-10-CM

## 2024-06-24 DIAGNOSIS — Z720 Tobacco use: Secondary | ICD-10-CM | POA: Diagnosis not present

## 2024-06-24 MED ORDER — CARIPRAZINE HCL 1.5 MG PO CAPS: 1.5000 mg | ORAL_CAPSULE | Freq: Every day | ORAL | 0 refills | Status: DC

## 2024-06-24 NOTE — Progress Notes (Addendum)
 "    Subjective:  Patient ID: Jonte Shiller, male    DOB: [birth year], 43 y.o.   MRN: [id number]  Patient Care Team: Deitra Morton Hummer, Nena, NP as PCP - General (Nurse Practitioner) Alvan Dorn FALCON, MD as PCP - Cardiology (Cardiology) Patient, No Pcp Per (General Practice)   Chief Complaint:  Medical Management of Chronic Issues (6 week )   HPI: Nikolos Billig is a 43 y.o. male presenting on 06/24/2024 for Medical Management of Chronic Issues (6 week )   Discussed the use of AI scribe software for clinical note transcription with the patient, who gave verbal consent to proceed.  History of Present Illness Jeziel Hoffmann is a 43 year old male who presents with depression and anxiety.  He reports symptoms of depression and anxiety, and his PHQ-9 and GAD-7 scores are high. He feels like he has let people down due to past mistakes and failures, struggling to see a way to fix these issues. He has thoughts of self-harm but no specific plan, stating that he tries not to act on these thoughts because 'other people need me around.'  He has been taking Lexapro  as prescribed but experienced a gap in medication due to uncertainty about the refill process. He recently resumed taking Lexapro  after obtaining a refill two days ago. The medication seemed to help at times, but he continues to have difficulty concentrating and feels like he is in a 'whirlwind.'  His sleep is problematic, with periods of oversleeping as an escape and occasional insomnia. He feels overwhelmed and tired, often feeling like he is always there for others but lacks support for himself. He gets irritated easily and has difficulty controlling his temper, often feeling like he is watching himself act out without being able to stop it. He misses activities he used to enjoy, such as hunting, fishing, and working on his truck, due to his current mental state.  He lives with his father, whom he helps care for, which he finds  stressful. He is currently unemployed and has been smoking about half a pack of cigarettes daily since he was [age]. He has a supportive wife who encourages him to seek care.    Flowsheet Row Office Visit from 06/24/2024 in [facility]  PHQ-9 Total Score 17       06/24/2024    3:56 PM 05/13/2024    3:14 PM  GAD 7 : Generalized Anxiety Score  Nervous, Anxious, on Edge 3 3  Control/stop worrying 3 3  Worry too much - different things 3 3  Trouble relaxing 3 1  Restless 3 2  Easily annoyed or irritable 3 3  Afraid - awful might happen 2 1  Total GAD 7 Score 20 16  Anxiety Difficulty Very difficult Extremely difficult      Relevant past medical, surgical, family, and social history reviewed and updated as indicated.  Allergies and medications reviewed and updated. Data reviewed: Chart in Epic.   Past Medical History:  Diagnosis Date   Back pain     Past Surgical History:  Procedure Laterality Date   ADENOIDECTOMY     FOREARM SURGERY     left-compound fracture   TYMPANOSTOMY TUBE PLACEMENT      Social History   Socioeconomic History   Marital status: Married    Spouse name: Not on file   Number of children: Not on file   Years of education: Not on file   Highest education level: Not on file  Occupational History   Not on file  Tobacco Use   Smoking status: Every Day    Current packs/day: 1.00    Average packs/day: 1 pack/day for 15.0 years (15.0 ttl pk-yrs)    Types: Cigarettes   Smokeless tobacco: Never  Substance and Sexual Activity   Alcohol use: Yes    Alcohol/week: 12.0 standard drinks of alcohol    Types: 12 Cans of beer per week   Drug use: No   Sexual activity: Yes    Birth control/protection: None  Other Topics Concern   Not on file  Social History Narrative   Not on file   Social Drivers of Health   Financial Resource Strain: Low Risk  (05/13/2024)   Overall Financial Resource Strain (CARDIA)    Difficulty  of Paying Living Expenses: Not hard at all  Food Insecurity: No Food Insecurity (05/13/2024)   Hunger Vital Sign    Worried About Running Out of Food in the Last Year: Never true    Ran Out of Food in the Last Year: Never true  Transportation Needs: No Transportation Needs (05/13/2024)   PRAPARE - Administrator, Civil Service (Medical): No    Lack of Transportation (Non-Medical): No  Physical Activity: Not on file  Stress: Not on file  Social Connections: Not on file  Intimate Partner Violence: Not At Risk (05/13/2024)   Humiliation, Afraid, Rape, and Kick questionnaire    Fear of Current or Ex-Partner: No    Emotionally Abused: No    Physically Abused: No    Sexually Abused: No    Outpatient Encounter Medications as of 06/24/2024  Medication Sig   atorvastatin  (LIPITOR) 10 MG tablet Take 1 tablet (10 mg total) by mouth daily.   cariprazine  (VRAYLAR ) 1.5 MG capsule Take 1 capsule (1.5 mg total) by mouth daily.   escitalopram  (LEXAPRO ) 10 MG tablet Take 1 tablet (10 mg total) by mouth daily.   tizanidine  (ZANAFLEX ) 2 MG capsule Take 1 capsule (2 mg total) by mouth 3 (three) times daily as needed for muscle spasms.   Vitamin D , Ergocalciferol , (DRISDOL ) 1.25 MG (50000 UNIT) CAPS capsule Take 1 capsule (50,000 Units total) by mouth every 7 (seven) days.   predniSONE  (DELTASONE ) 10 MG tablet Take 1 tablet (10 mg total) by mouth daily with breakfast. (Patient not taking: Reported on 06/24/2024)   No facility-administered encounter medications on file as of 06/24/2024.    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 PATIENT IS NOT SURE WHETHER HE IS ALLERGIC TO THIS MEDICATION    Pertinent ROS per HPI, otherwise unremarkable      Objective:  BP 111/72   Pulse 87   Temp 97.8 F (36.6 C) (Temporal)   Ht 6' 1 (1.854 m)   Wt 208 lb 6.4 oz (94.5 kg)   SpO2 98%   BMI 27.50 kg/m    Wt Readings from Last 3  Encounters:  06/24/24 208 lb 6.4 oz (94.5 kg)  05/13/24 212 lb 9.6 oz (96.4 kg)  12/11/23 219 lb (99.3 kg)    Physical Exam Vitals and nursing note reviewed.  Constitutional:      General: He is not in acute distress.    Appearance: Normal appearance.  HENT:     Head: Normocephalic and atraumatic.     Nose: Nose normal.     Mouth/Throat:     Mouth: Mucous membranes are moist.  Eyes:  Extraocular Movements: Extraocular movements intact.     Conjunctiva/sclera: Conjunctivae normal.     Pupils: Pupils are equal, round, and reactive to light.  Cardiovascular:     Heart sounds: Normal heart sounds.  Pulmonary:     Effort: Pulmonary effort is normal.     Breath sounds: Normal breath sounds.  Skin:    General: Skin is warm and dry.     Findings: No rash.  Neurological:     Mental Status: He is alert and oriented to person, place, and time.  Psychiatric:        Attention and Perception: Attention and perception normal.        Mood and Affect: Mood is anxious and depressed. Affect is tearful.        Speech: Speech normal.        Behavior: Behavior normal. Behavior is cooperative.        Thought Content: Thought content normal. Thought content does not include homicidal or suicidal ideation. Thought content does not include homicidal or suicidal plan.        Cognition and Memory: Cognition and memory normal.        Judgment: Judgment normal.    Physical Exam      Results for orders placed or performed in visit on 05/13/24  CBC with Differential/Platelet   Collection Time: 05/13/24  4:04 PM  Result Value Ref Range   WBC 7.6 3.4 - 10.8 x10E3/uL   RBC 5.04 4.14 - 5.80 x10E6/uL   Hemoglobin 16.1 13.0 - 17.7 g/dL   Hematocrit 52.8 62.4 - 51.0 %   MCV 94 79 - 97 fL   MCH 31.9 26.6 - 33.0 pg   MCHC 34.2 31.5 - 35.7 g/dL   RDW 87.0 88.3 - 84.5 %   Platelets 263 150 - 450 x10E3/uL   Neutrophils 63 Not Estab. %   Lymphs 22 Not Estab. %   Monocytes 8 Not Estab. %   Eos 6  Not Estab. %   Basos 1 Not Estab. %   Neutrophils Absolute 4.9 1.4 - 7.0 x10E3/uL   Lymphocytes Absolute 1.7 0.7 - 3.1 x10E3/uL   Monocytes Absolute 0.6 0.1 - 0.9 x10E3/uL   EOS (ABSOLUTE) 0.5 (H) 0.0 - 0.4 x10E3/uL   Basophils Absolute 0.1 0.0 - 0.2 x10E3/uL   Immature Granulocytes 0 Not Estab. %   Immature Grans (Abs) 0.0 0.0 - 0.1 x10E3/uL  CMP14+EGFR   Collection Time: 05/13/24  4:04 PM  Result Value Ref Range   Glucose 85 70 - 99 mg/dL   BUN 11 6 - 24 mg/dL   Creatinine, Ser 8.96 0.76 - 1.27 mg/dL   eGFR 92 >40 fO/fpw/8.26   BUN/Creatinine Ratio 11 9 - 20   Sodium 138 134 - 144 mmol/L   Potassium 4.5 3.5 - 5.2 mmol/L   Chloride 103 96 - 106 mmol/L   CO2 23 20 - 29 mmol/L   Calcium  9.1 8.7 - 10.2 mg/dL   Total Protein 6.8 6.0 - 8.5 g/dL   Albumin 4.5 4.1 - 5.1 g/dL   Globulin, Total 2.3 1.5 - 4.5 g/dL   Bilirubin Total 0.4 0.0 - 1.2 mg/dL   Alkaline Phosphatase 89 44 - 121 IU/L   AST 15 0 - 40 IU/L   ALT 16 0 - 44 IU/L  HepB+HepC+HIV Panel   Collection Time: 05/13/24  4:04 PM  Result Value Ref Range   Hepatitis B Surface Ag Negative Negative   Hep B E Ag Negative Negative  Hep B C IgM Negative Negative   Hep B Core Total Ab Negative Negative   Hep B E Ab Non Reactive Negative   Hep B Surface Ab, Qual Non Reactive    Hep C Virus Ab Non Reactive Non Reactive   HIV Screen 4th Generation wRfx Non Reactive Non Reactive  Lipid panel   Collection Time: 05/13/24  4:04 PM  Result Value Ref Range   Cholesterol, Total 238 (H) 100 - 199 mg/dL   Triglycerides 813 (H) 0 - 149 mg/dL   HDL 40 >60 mg/dL   VLDL Cholesterol Cal 34 5 - 40 mg/dL   LDL Chol Calc (NIH) 835 (H) 0 - 99 mg/dL   Chol/HDL Ratio 6.0 (H) 0.0 - 5.0 ratio  Thyroid  Panel With TSH   Collection Time: 05/13/24  4:04 PM  Result Value Ref Range   TSH 1.380 0.450 - 4.500 uIU/mL   T4, Total 6.9 4.5 - 12.0 ug/dL   T3 Uptake Ratio 29 24 - 39 %   Free Thyroxine Index 2.0 1.2 - 4.9  VITAMIN D  25 Hydroxy (Vit-D  Deficiency, Fractures)   Collection Time: 05/13/24  4:04 PM  Result Value Ref Range   Vit D, 25-Hydroxy 17.2 (L) 30.0 - 100.0 ng/mL       Pertinent labs & imaging results that were available during my care of the patient were reviewed by me and considered in my medical decision making.  Assessment & Plan:  Britton was seen today for medical management of chronic issues.  Diagnoses and all orders for this visit:  Tobacco abuse  Severe episode of recurrent major depressive disorder, without psychotic features (HCC) -     Amb ref to [facility] -     cariprazine  (VRAYLAR ) 1.5 MG capsule; Take 1 capsule (1.5 mg total) by mouth daily.  GAD (generalized anxiety disorder) -     Amb ref to [facility] -     cariprazine  (VRAYLAR ) 1.5 MG capsule; Take 1 capsule (1.5 mg total) by mouth daily.  Mood swings -     cariprazine  (VRAYLAR ) 1.5 MG capsule; Take 1 capsule (1.5 mg total) by mouth daily.     Assessment and Plan Deshun is a 43 year old Caucasian male seen today for chronic disease management, no acute distress Assessment & Plan Major depressive disorder with suicidal ideation Severe recurrent major depressive disorder with suicidal ideation. No active plan for self-harm. Hypersomnia and concentration difficulties noted. Previously benefited from Lexapro . No prior mental health care. Sold guns due to self-harm concerns. Open to counseling. - Start Vraylar  1.5 mg daily. - Restart Lexapro  10 mg daily with Vraylar . - Refer to counselor for urgent appointment. - Schedule follow-up in one month. - Monitor closely due to holiday season risk.  Generalized anxiety disorder Severe anxiety indicated by high GAD-7 score. Reports overthinking, stress, irritability, and focus issues. No prior mental health treatment. Open to discussing feelings and seeking help. - Refer to counselor for urgent appointment. - Start Vraylar  1.5 mg daily. - Restart Lexapro  10  mg daily with Vraylar . - Schedule follow-up in one month.  Patient and/or legal guardian verbally consented to [facility] services about presenting concerns and psychiatric consultation as appropriate.  The services will be billed as appropriate for the patient   Tobacco use disorder Chronic tobacco use since age 73, currently smoking half a pack per day. Smoking Cessation Counseling - 4-Minute Plan: Discussed smoking history and assessed readiness to quit. -Briefly educated patient on health  risks of continued smoking, including -increased risk of cardiovascular disease, respiratory conditions, and cancer. -Explored motivation to quit and identified potential barriers  Provided brief counseling on behavioral strategies: -Avoid triggers , alcohol, certain social settings. -Offered support resources: -1-800-QUIT-NOW or smokefree.gov -Mobile apps such as QuitGuide or QuitStart Discussed pharmacotherapy options: Patient expressed no interest] in quitting at this time. Will reassess readiness and offer continued support at future visits.   Goals of Care Desires mental health improvement and re-engagement in activities. Acknowledges responsibility towards wife and father. Open to treatment and counseling.      Continue all other maintenance medications.  Follow up plan: Return in about 1 month (around 07/25/2024) for medication management.   Continue healthy lifestyle choices, including diet (rich in fruits, vegetables, and lean proteins, and low in salt and simple carbohydrates) and exercise (at least 30 minutes of moderate physical activity daily).  Educational handout given for    Clinical References  Generalized Anxiety Disorder, Adult Generalized anxiety disorder (GAD) is a mental health condition. Unlike normal worries, anxiety related to GAD is not triggered by a specific event. These worries do not fade or get better with time. GAD interferes with  relationships, work, and school. GAD symptoms can vary from mild to severe. People with severe GAD can have intense waves of anxiety with physical symptoms that are similar to panic attacks. What are the causes? The exact cause of GAD is not known, but the following are believed to have an impact: Differences in natural brain chemicals. Genes passed down from parents to children. Differences in the way threats are perceived. Development and stress during childhood. Personality. What increases the risk? The following factors may make you more likely to develop this condition: Being male. Having a family history of anxiety disorders. Being very shy. Experiencing very stressful life events, such as the death of a loved one. Having a very stressful family environment. What are the signs or symptoms? People with GAD often worry excessively about many things in their lives, such as their health and family. Symptoms may also include: Mental and emotional symptoms: Worrying excessively about natural disasters. Fear of being late. Difficulty concentrating. Fears that others are judging your performance. Physical symptoms: Fatigue. Headaches, muscle tension, muscle twitches, trembling, or feeling shaky. Feeling like your heart is pounding or beating very fast. Feeling out of breath or like you cannot take a deep breath. Having trouble falling asleep or staying asleep, or experiencing restlessness. Sweating. Nausea, diarrhea, or irritable bowel syndrome (IBS). Behavioral symptoms: Experiencing erratic moods or irritability. Avoidance of new situations. Avoidance of people. Extreme difficulty making decisions. How is this diagnosed? This condition is diagnosed based on your symptoms and medical history. You will also have a physical exam. Your health care provider may perform tests to rule out other possible causes of your symptoms. To be diagnosed with GAD, a person must have anxiety  that: Is out of his or her control. Affects several different aspects of his or her life, such as work and relationships. Causes distress that makes him or her unable to take part in normal activities. Includes at least three symptoms of GAD, such as restlessness, fatigue, trouble concentrating, irritability, muscle tension, or sleep problems. Before your health care provider can confirm a diagnosis of GAD, these symptoms must be present more days than they are not, and they must last for 6 months or longer. How is this treated? This condition may be treated with: Medicine. Antidepressant medicine is usually prescribed for  long-term daily control. Anti-anxiety medicines may be added in severe cases, especially when panic attacks occur. Talk therapy (psychotherapy). Certain types of talk therapy can be helpful in treating GAD by providing support, education, and guidance. Options include: Cognitive behavioral therapy (CBT). People learn coping skills and self-calming techniques to ease their physical symptoms. They learn to identify unrealistic thoughts and behaviors and to replace them with more appropriate thoughts and behaviors. Acceptance and commitment therapy (ACT). This treatment teaches people how to be mindful as a way to cope with unwanted thoughts and feelings. Biofeedback. This process trains you to manage your body's response (physiological response) through breathing techniques and relaxation methods. You will work with a therapist while machines are used to monitor your physical symptoms. Stress management techniques. These include yoga, meditation, and exercise. A mental health specialist can help determine which treatment is best for you. Some people see improvement with one type of therapy. However, other people require a combination of therapies. Follow these instructions at home: Lifestyle Maintain a consistent routine and schedule. Anticipate stressful situations. Create a plan  and allow extra time to work with your plan. Practice stress management or self-calming techniques that you have learned from your therapist or your health care provider. Exercise regularly and spend time outdoors. Eat a healthy diet that includes plenty of vegetables, fruits, whole grains, low-fat dairy products, and lean protein. Do not eat a lot of foods that are high in fat, added sugar, or salt (sodium). Drink plenty of water. Avoid alcohol. Alcohol can increase anxiety. Avoid caffeine and certain over-the-counter cold medicines. These may make you feel worse. Ask your pharmacist which medicines to avoid. General instructions Take over-the-counter and prescription medicines only as told by your health care provider. Understand that you are likely to have setbacks. Accept this and be kind to yourself as you persist to take better care of yourself. Anticipate stressful situations. Create a plan and allow extra time to work with your plan. Recognize and accept your accomplishments, even if you judge them as small. Spend time with people who care about you. Keep all follow-up visits. This is important. Where to find more information [employer] of Mental Health: http://www.maynard.net/ Substance Abuse and Mental Health Services: skateoasis.com.pt Contact a health care provider if: Your symptoms do not get better. Your symptoms get worse. You have signs of depression, such as: A persistently sad or irritable mood. Loss of enjoyment in activities that used to bring you joy. Change in weight or eating. Changes in sleeping habits. Get help right away if: You have thoughts about hurting yourself or others. If you ever feel like you may hurt yourself or others, or have thoughts about taking your own life, get help right away. Go to your nearest emergency department or: Call your local emergency services (911 in the [place] Call a suicide crisis helpline, such as the National Suicide Prevention  Lifeline at (904)825-1509 or 988 in the [place] This is open 24 hours a day in the [place] If youre a Veteran: Call 988 and press 1. This is open 24 hours a day. Text the [employer] at 563-187-0245. Summary Generalized anxiety disorder (GAD) is a mental health condition that involves worry that is not triggered by a specific event. People with GAD often worry excessively about many things in their lives, such as their health and family. GAD may cause symptoms such as restlessness, trouble concentrating, sleep problems, frequent sweating, nausea, diarrhea, headaches, and trembling or muscle twitching. A mental health  specialist can help determine which treatment is best for you. Some people see improvement with one type of therapy. However, other people require a combination of therapies. This information is not intended to replace advice given to you by your health care provider. Make sure you discuss any questions you have with your health care provider. Document Revised: 04/05/2023 Document Reviewed: 12/12/2020 Elsevier Patient Education  2024 [employer] Inc. Managing Anxiety, Adult After being diagnosed with anxiety, you may be relieved to know why you have felt or behaved a certain way. You may also feel overwhelmed about the treatment ahead and what it will mean for your life. With care and support, you can manage your anxiety. How to manage lifestyle changes Understanding the difference between stress and anxiety Although stress can play a role in anxiety, it is not the same as anxiety. Stress is your body's reaction to life changes and events, both good and bad. Stress is often caused by something external, such as a deadline, test, or competition. It normally goes away after the event has ended and will last just a few hours. But, stress can be ongoing and can lead to more than just stress. Anxiety is caused by something internal, such as imagining a terrible outcome or worrying that  something will go wrong that will greatly upset you. Anxiety often does not go away even after the event is over, and it can become a long-term (chronic) worry. Lowering stress and anxiety Talk with your health care provider or a counselor to learn more about lowering anxiety and stress. They may suggest tension-reduction techniques, such as: Music. Spend time creating or listening to music that you enjoy and that inspires you. Mindfulness-based meditation. Practice being aware of your normal breaths while not trying to control your breathing. It can be done while sitting or walking. Centering prayer. Focus on a word, phrase, or sacred image that means something to you and brings you peace. Deep breathing. Expand your stomach and inhale slowly through your nose. Hold your breath for 3-5 seconds. Then breathe out slowly, letting your stomach muscles relax. Self-talk. Learn to notice and spot thought patterns that lead to anxiety reactions. Change those patterns to thoughts that feel peaceful. Muscle relaxation. Take time to tense muscles and then relax them. Choose a tension-reduction technique that fits your lifestyle and personality. These techniques take time and practice. Set aside 5-15 minutes a day to do them. Specialized therapists can offer counseling and training in these techniques. The training to help with anxiety may be covered by some insurance plans. Other things you can do to manage stress and anxiety include: Keeping a stress diary. This can help you learn what triggers your reaction and then learn ways to manage your response. Thinking about how you react to certain situations. You may not be able to control everything, but you can control your response. Making time for activities that help you relax and not feeling guilty about spending your time in this way. Doing visual imagery. This involves imagining or creating mental pictures to help you relax. Practicing yoga. Through yoga  poses, you can lower tension and relax.   Medicines Medicines for anxiety include: Antidepressant medicines. These are usually prescribed for long-term daily control. Anti-anxiety medicines. These may be added in severe cases, especially when panic attacks occur. When used together, medicines, psychotherapy, and tension-reduction techniques may be the most effective treatment. Relationships Relationships can play a big part in helping you recover. Spend more time connecting with trusted  friends and family members. Think about going to couples counseling if you have a partner, taking family education classes, or going to family therapy. Therapy can help you and others better understand your anxiety. How to recognize changes in your anxiety Everyone responds differently to treatment for anxiety. Recovery from anxiety happens when symptoms lessen and stop interfering with your daily life at home or work. This may mean that you will start to: Have better concentration and focus. Worry will interfere less in your daily thinking. Sleep better. Be less irritable. Have more energy. Have improved memory. Try to recognize when your condition is getting worse. Contact your provider if your symptoms interfere with home or work and you feel like your condition is not improving. Follow these instructions at home: Activity Exercise. Adults should: Exercise for at least 150 minutes each week. The exercise should increase your heart rate and make you sweat (moderate-intensity exercise). Do strengthening exercises at least twice a week. Get the right amount and quality of sleep. Most adults need 7-9 hours of sleep each night. Lifestyle  Eat a healthy diet that includes plenty of vegetables, fruits, whole grains, low-fat dairy products, and lean protein. Do not eat a lot of foods that are high in fats, added sugars, or salt (sodium). Make choices that simplify your life. Do not use any products that contain  nicotine or tobacco. These products include cigarettes, chewing tobacco, and vaping devices, such as e-cigarettes. If you need help quitting, ask your provider. Avoid caffeine, alcohol, and certain over-the-counter cold medicines. These may make you feel worse. Ask your pharmacist which medicines to avoid. General instructions Take over-the-counter and prescription medicines only as told by your provider. Keep all follow-up visits. This is to make sure you are managing your anxiety well or if you need more support. Where to find support You can get help and support from: Self-help groups. Online and [employer]. A trusted spiritual leader. Couples counseling. Family education classes. Family therapy. Where to find more information You may find that joining a support group helps you deal with your anxiety. The following sources can help you find counselors or support groups near you: Mental Health America: mentalhealthamerica.net Anxiety and Depression Association of America (ADAA): adaa.org The First American on Mental Illness (NAMI): nami.org Contact a health care provider if: You have a hard time staying focused or finishing tasks. You spend many hours a day feeling worried about everyday life. You are very tired because you cannot stop worrying. You start to have headaches or often feel tense. You have chronic nausea or diarrhea. Get help right away if: Your heart feels like it is racing. You have shortness of breath. You have thoughts of hurting yourself or others. Get help right away if you feel like you may hurt yourself or others, or have thoughts about taking your own life. Go to your nearest emergency room or: Call 911. Call the National Suicide Prevention Lifeline at 657-727-1911 or 988. This is open 24 hours a day. Text the Crisis Text Line at (249)100-3546. This information is not intended to replace advice given to you by your health care provider. Make sure you  discuss any questions you have with your health care provider. Document Revised: 05/30/2022 Document Reviewed: 12/12/2020 Elsevier Patient Education  2024 Elsevier Inc. Mindfulness-Based Stress Reduction: What to Know Mindfulness-based stress reduction (MBSR) is a mindfulness meditation program that normally takes place over 8 weeks. It usually includes weekly group classes and daily exercises to do at home. What  are the benefits of MBSR? Mindfulness meditation therapies, like MBSR, can change a person's brain and body in good ways, and make them healthier. MBSR can have many benefits, such as: Helping to lower stress hormones. Decreasing symptoms or helping to deal with symptoms of different conditions, like: Anxiety, which is feeling worried or nervous. Long-lasting pain. This is pain that lasts more than 3 months. Stress and worry. Trouble sleeping. Headaches, like migraines and tension headaches. Irritable bowel syndrome. Helping to handle stress from things you can't control, like: Long-term illnesses, especially if you have a lot of pain or other difficult symptoms. Big life events. Stress at work. Stress from taking care of someone else. Types of MBSR exercises Mindfulness. This is a common type of meditation. Meditation. It helps you focus your mind to feel calm and happy. It has two main parts: paying attention and accepting. Paying attention means focusing on what is happening right now. This usually means noticing your breathing, your thoughts, how your body feels, and your emotions. Accepting means noticing these feelings and sensations without judging them. Instead of reacting to these thoughts or feelings, you just observe them and let them pass. MSBR exercises include: Body scanning. This is a mindfulness exercise where you pay attention to how different parts of your body feel. You can do this while lying down or sitting up. Sitting meditations. In this exercise, you  focus on something like your breathing. When your mind starts to wander, gently bring it back to your breath. Keep doing this every time you notice your mind wandering. Mindful movements. This exercise involves moving and stretching your body slowly while paying attention to how it feels. Mindful Tasks. This means paying attention to how your body feels while doing things like walking or eating. Follow these instructions at home:  Find an in-person MBSR program or find a program that is online. Find a podcast or recording that provides guidance for MSBR exercises. Look for a therapist who knows how to use MBSR. Follow your treatment plan as told by your health care provider. This may include taking regular medicines and making changes to your diet or lifestyle. Where to find more information You can find more information about MBSR from: Your provider. Community-based meditation centers or programs. American Psychological Association at http://forbes-duran.com/. This information is not intended to replace advice given to you by your health care provider. Make sure you discuss any questions you have with your health care provider. Document Revised: 10/25/2023 Document Reviewed: 10/25/2023 Elsevier Patient Education  2025 Elsevier Inc. Managing Depression, Adult Depression is a mental health condition that affects your thoughts, feelings, and actions. Being diagnosed with depression can bring you relief if you did not know why you have felt or behaved a certain way. It could also leave you feeling overwhelmed. Finding ways to manage your symptoms can help you feel more positive about your future. How to manage lifestyle changes Being depressed is difficult. Depression can increase the level of everyday stress. Stress can make depression symptoms worse. You may believe your symptoms cannot be managed or will never improve. However, there are many things you can try to help  manage your symptoms. There is hope. Managing stress  Stress is your body's reaction to life changes and events, both good and bad. Stress can add to your feelings of depression. Learning to manage your stress can help lessen your feelings of depression. Try some of the following approaches to reducing your stress (stress reduction techniques): Listen to  music that you enjoy and that inspires you. Try using a meditation app or take a meditation class. Develop a practice that helps you connect with your spiritual self. Walk in nature, pray, or go to a place of worship. Practice deep breathing. To do this, inhale slowly through your nose. Pause at the top of your inhale for a few seconds and then exhale slowly, letting yourself relax. Repeat this three or four times. Practice yoga to help relax and work your muscles. Choose a stress reduction technique that works for you. These techniques take time and practice to develop. Set aside 5-15 minutes a day to do them. Therapists can offer training in these techniques. Do these things to help manage stress: Keep a journal. Know your limits. Set healthy boundaries for yourself and others, such as saying no when you think something is too much. Pay attention to how you react to certain situations. You may not be able to control everything, but you can change your reaction. Add humor to your life by watching funny movies or shows. Make time for activities that you enjoy and that relax you. Spend less time using electronics, especially at night before bed. The light from screens can make your brain think it is time to get up rather than go to bed.   Medicines Medicines, such as antidepressants, are often a part of treatment for depression. Talk with your pharmacist or health care provider about all the medicines, supplements, and herbal products that you take, their possible side effects, and what medicines and other products are safe to take  together. Make sure to report any side effects you may have to your health care provider. Relationships Your health care provider may suggest family therapy, couples therapy, or individual therapy as part of your treatment. How to recognize changes Everyone responds differently to treatment for depression. As you recover from depression, you may start to: Have more interest in doing activities. Feel more hopeful. Have more energy. Eat a more regular amount of food. Have better mental focus. It is important to recognize if your depression is not getting better or is getting worse. The symptoms you had in the beginning may return, such as: Feeling tired. Eating too much or too little. Sleeping too much or too little. Feeling restless, agitated, or hopeless. Trouble focusing or making decisions. Having unexplained aches and pains. Feeling irritable, angry, or aggressive. If you or your family members notice these symptoms coming back, let your health care provider know right away. Follow these instructions at home: Activity Try to get some form of exercise each day, such as walking. Try yoga, mindfulness, or other stress reduction techniques. Participate in group activities if you are able. Lifestyle Get enough sleep. Cut down on or stop using caffeine, tobacco, alcohol, and any other harmful substances. Eat a healthy diet that includes plenty of vegetables, fruits, whole grains, low-fat dairy products, and lean protein. Limit foods that are high in solid fats, added sugar, or salt (sodium). General instructions Take over-the-counter and prescription medicines only as told by your health care provider. Keep all follow-up visits. It is important for your health care provider to check on your mood, behavior, and medicines. Your health care provider may need to make changes to your treatment. Where to find support Talking to others  Friends and family members can be sources of support  and guidance. Talk to trusted friends or family members about your condition. Explain your symptoms and let them know that you are  working with a health care provider to treat your depression. Tell friends and family how they can help. Finances Find mental health providers that fit with your financial situation. Talk with your health care provider if you are worried about access to food, housing, or medicine. Call your insurance company to learn about your co-pays and prescription plan. Where to find more information You can find support in your area from: Anxiety and Depression Association of America (ADAA): adaa.org Mental Health America: mentalhealthamerica.net The First American on Mental Illness: nami.org Contact a health care provider if: You stop taking your antidepressant medicines, and you have any of these symptoms: Nausea. Headache. Light-headedness. Chills and body aches. Not being able to sleep (insomnia). You or your friends and family think your depression is getting worse. Get help right away if: You have thoughts of hurting yourself or others. Get help right away if you feel like you may hurt yourself or others, or have thoughts about taking your own life. Go to your nearest emergency room or: Call 911. Call the National Suicide Prevention Lifeline at 6712165017 or 988. This is open 24 hours a day. Text the Crisis Text Line at 559-116-2043. This information is not intended to replace advice given to you by your health care provider. Make sure you discuss any questions you have with your health care provider. Document Revised: 12/27/2021 Document Reviewed: 12/27/2021 Elsevier Patient Education  2024 [employer]. Feeling Very Unhappy, Dissatisfied, or Uneasy (Dysphoria): What to Do Dysphoria is when someone has very strong feelings of uneasiness, unhappiness, or dissatisfaction. It can also involve feeling sad, worried, or easily annoyed. A person may have symptoms of  dysphoria because of many reasons. For example, it may be caused by stressful events or situations, even when the event or situation is expected or known.  If symptoms of dysphoria last longer than two weeks, it's important to talk to a health care professional because the symptoms may be caused by a mental health condition, such as an adjustment disorder, depression, or anxiety disorder. Follow these instructions at home:  Tips and recommendations The following tips may help with managing feelings of dysphoria: Take care of yourself. Eat a healthy diet. Exercise regularly. For example, aim for about 20 minutes of physical activity every day. Make time to do things that you enjoy. Get enough sleep. Doing certain things can help with getting enough sleep. You can: Turn off screens an hour or so before you get ready for sleep. Have a comfortable temperature in the room for sleeping. Do your best to have a regular time for going to bed and waking up. Avoid using substances to cope, including alcohol, nicotine, or other drugs. Talk about your feelings with people you trust, like loved ones, family, or friends. Learn about things that can help with coping and reducing stress, such as yoga and meditation. Try to do them often. General instructions Take your medicines only as told. Check with your provider before taking any herbs or supplements. Where to find support Your provider. Loved ones, family, or trusted friends. Contact the NAMI HelpLine. Available Monday- Friday, 10 a.m. - 10 p.m. ET. Call 1-800-950-NAMI 870-660-6848). Text HelpLine to 563-426-8639. Email: helpline@nami .org. Where to find more information For information about living with mental health conditions visit: www.nami.org Contact a health care provider if: Your symptoms are not getting better or have lasted longer than 2 weeks. Your symptoms are getting worse. You start to have symptoms you haven't had before, and the symptoms worry  you.  Get help right away if: You feel like you may hurt yourself or others. You have thoughts about taking your own life. You have other thoughts or feelings that worry you. These symptoms may be an emergency. Take one of these steps right away: Go to your nearest emergency room. Call 911. Contact The 988 Suicide & Crisis Lifeline (24 hours a day, 7 days a week, free and confidential): Call or text 988. Chat online at chat.newsactor.se. For Veterans and their loved ones, contact the [employer]: Call 988 and press 1. Text (781) 207-5709. Chat online at veteranscrisisline.net This information is not intended to replace advice given to you by your health care provider. Make sure you discuss any questions you have with your health care provider. Document Revised: 09/13/2023 Document Reviewed: 09/13/2023 Elsevier Patient Education  2025 [employer]. Smokeless Tobacco Information, Adult  Tobacco is a leafy plant that contains a chemical called nicotine. Tobacco use is harmful to your health. Nicotine affects the chemicals in your brain, and this can cause you to crave nicotine. These cravings can lead to addiction. Smokeless tobacco is tobacco that you put in your mouth instead of smoking it. It may also be called chewing tobacco or snuff. Smokeless tobacco is made from the leaves of tobacco plants and comes in many forms, such as: Loose, dry leaves. Plugs or twists. Moist pouches. Dissolving lozenges or strips. Chewing, sucking, or holding the tobacco in your mouth causes your mouth to make more saliva. The saliva mixes with the tobacco to make tobacco juice that is swallowed or spit out. How can smokeless tobacco use affect me? All forms of tobacco contain many chemicals that can harm every organ in the body. Using smokeless tobacco increases your risk for: Cancer. Tobacco use is one of the leading causes of cancer. Smokeless tobacco is linked to cancer of the mouth, esophagus, and  pancreas. Other long-term health problems, including high blood pressure, heart disease, and stroke. Addiction. Pregnancy complications. Pregnant women who use smokeless tobacco are more likely to have a miscarriage or deliver a baby too early (premature delivery). Mouth and dental problems, such as: Bad breath. Teeth that look yellow or brown. Mouth sores. Cracking and bleeding lips. Gum recession, gum disease, or tooth decay. Lesions on the soft tissues of your mouth (leukoplakia). The benefits of not using smokeless tobacco include: A healthy mind and body. Saving money. You avoid the cost of buying tobacco and the cost of treating illnesses that are caused by tobacco. What actions can I take to quit using tobacco? Ask your health care provider for help to quit using smokeless tobacco. This may involve treatment. These tips may also help you quit: Pick a date to quit. Set a date within the next 2 weeks. This gives you time to prepare. Write down the reasons why you are quitting. Keep this list in places where you will see it often, such as on your bathroom mirror or in your car or wallet. Identify the people, places, things, and activities that make you want to use smokeless tobacco (triggers). Avoid them. Tell your family and friends that you are quitting. Look for a support group in your area. Having support can make quitting easier. Get rid of any tobacco you have and remove any tobacco smells. To do this: Throw away all containers of tobacco at home, at work, and in your car. Throw away any other items that you use regularly when you chew tobacco. Clean your car and make sure to  remove all tobacco-related items. Clean your home, including curtains and carpets. Keep track of how many days have passed since you quit. It may help to cross days off a calendar. Remembering how long and hard you have worked to quit can help you avoid using smokeless tobacco again. Stay positive. Be  prepared for cravings. It is common to slip up when you first quit, so take it one day at a time. Stay busy and take care of your body. Get plenty of exercise. Drink enough water to keep your urine pale yellow. Where to find support Ask your health care provider if there is a local support group for quitting smokeless tobacco and any available online resources, such as: Smoke Free: www.veterans.smokefree.gov Be Tobacco Free: eopquic.com Tobacco Quitline: 1-855-QUIT-VET 330-334-6065). Hotlines, such as 1-800-QUIT-NOW 432-679-2491). Where to find more information You can learn more about the risks of using smokeless tobacco and the benefits of quitting from these sources: American Cancer Society: www.cancer.org National Cancer Institute: www.cancer.gov Centers for Disease Control and Prevention: footballexhibition.com.br Food and Drug Administration: https://mcknight.com/ Contact a health care provider if you have: Trouble quitting smokeless tobacco use on your own. White or other discolored patches in your mouth. Difficulty swallowing. A change in your voice. Unexplained weight loss. Stomach pain, nausea, or vomiting. Summary Smokeless tobacco contains many different chemicals that are known to cause cancer. Nicotine is an addictive chemical in smokeless tobacco that affects your brain. The benefits of not using smokeless tobacco include having a healthy mind and body and saving money. Tell your family and friends that you are quitting. Having support can make quitting easier. Ask your health care provider for help quitting smokeless tobacco. This may involve treatment. This information is not intended to replace advice given to you by your health care provider. Make sure you discuss any questions you have with your health care provider. Document Revised: 05/18/2021 Document Reviewed: 05/18/2021 Elsevier Patient Education  2024 Elsevier Inc. Smoking and Musculoskeletal  Health Smoking is bad for your health. Most people know that smoking causes lung disease, heart disease, and cancer. But people may not realize that it also affects their bones, muscles, and joints (musculoskeletal system). When you smoke, the effects on your lungs and heart result in less oxygen for your musculoskeletal system. This can lead to poor bone and joint health. How can smoking affect my musculoskeletal health? Smoking can: Increase your risk of having weak, thin bones (osteoporosis). Elderly smokers are at higher risk for bone fractures related to osteoporosis. Decrease the ability of bone-forming cells to make and replace bone (in addition to reducing oxygen and blood flow). Reduce your body's ability to absorb calcium  from your diet. Less calcium  means weaker bones. Interfere with the breakdown of the male hormone estrogen. Smoking lowers estrogen, which is a hormone that helps keep bones strong. Women who smoke may have earlier menopause. Menopause is a risk factor for osteoporosis. Weaken the tissues that attach bones to muscles (tendons). This can lead to shoulder, back, and other joint injuries. Increase your risk of rheumatoid arthritis or make the condition worse if you already have it. Slow down healing and increase your risk of infection and other complications if you have a bone fracture or surgery that involves your musculoskeletal system. What actions can I take to prevent musculoskeletal problems? Quit smoking     Do not use any products that contain nicotine or tobacco. These products include cigarettes, chewing tobacco, and vaping devices, such as e-cigarettes. These can delay bone  healing. If you need help quitting, ask your health care provider. Even stopping later in life can improve musculoskeletal health. Do not replace cigarette smoking with e-cigarettes. The safety of e-cigarettes is not known, and some may contain harmful chemicals. Make a plan to quit smoking  and commit to it. Look for programs to help you, and ask your health care provider for recommendations and ideas. Talk with your health care provider about using nicotine replacement medicines to help you quit, such as gum, lozenges, patches, sprays, or pills. Make other lifestyle changes  Eat a healthy diet that includes calcium  and vitamin D . These nutrients are important for bone health. Get plenty of exercise. Weight-bearing and strength exercises are best for musculoskeletal health. Ask your health care provider what type of exercise is safe for you. If you drink alcohol: Limit how much you have to: 0-1 drink a day for women who are not pregnant. 0-2 drinks a day for men. Know how much alcohol is in your drink. In the [place] one drink equals one 12 oz bottle of beer (355 mL), one 5 oz glass of wine (148 mL), or one 1 oz glass of hard liquor (44 mL). Where to find more information You may find more information about the effects of smoking on musculoskeletal health, and quitting smoking from: American Academy of Orthopaedic Surgeons: orthoinfo.aaos.org [employer] of Health, Osteoporosis and Related Bone Diseases [employer]: bones.http://www.myers.net/ Contact a health care provider if: You need help to quit smoking. Summary When you smoke, the effects on your lungs and heart result in less oxygen for your musculoskeletal system. Even stopping smoking later in life can improve musculoskeletal health. Do not use any products that contain nicotine or tobacco, such as cigarettes and e-cigarettes. If you need help quitting, ask your health care provider. This information is not intended to replace advice given to you by your health care provider. Make sure you discuss any questions you have with your health care provider. Document Revised: 05/19/2021 Document Reviewed: 05/19/2021 Elsevier Patient Education  2024 Elsevier Inc.  The above assessment and management plan was discussed  with the patient. The patient verbalized understanding of and has agreed to the management plan. Patient is aware to call the clinic if they develop any new symptoms or if symptoms persist or worsen. Patient is aware when to return to the clinic for a follow-up visit. Patient educated on when it is appropriate to go to the emergency department.   Rashan Rounsaville [place] Thompson, DNP [facility] [address] [place], [place] [address] [phone]   "

## 2024-06-30 ENCOUNTER — Other Ambulatory Visit (HOSPITAL_COMMUNITY): Payer: Self-pay

## 2024-07-08 ENCOUNTER — Telehealth: Payer: Self-pay

## 2024-07-08 NOTE — Telephone Encounter (Unsigned)
 Copied from CRM #[id number]. Topic: Referral - Status >> Jul 04, 2024 10:24 AM Delon DASEN wrote: Reason for CRM: Wife checking status of referral, patient needs to be seen by therapist.

## 2024-07-09 NOTE — Telephone Encounter (Signed)
Noted  -LS

## 2024-07-09 NOTE — Telephone Encounter (Signed)
 Patient has been scheduled and is aware of Appt information.

## 2024-07-15 ENCOUNTER — Ambulatory Visit: Admitting: Professional Counselor

## 2024-07-15 DIAGNOSIS — F332 Major depressive disorder, recurrent severe without psychotic features: Secondary | ICD-10-CM
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
## 2024-07-16 DIAGNOSIS — Z419 Encounter for procedure for purposes other than remedying health state, unspecified: Secondary | ICD-10-CM | POA: Diagnosis not present

## 2024-07-18 NOTE — Patient Instructions (Signed)
 If your symptoms worsen or you have thoughts of suicide/homicide, PLEASE SEEK IMMEDIATE MEDICAL ATTENTION.  You may always call:   National Suicide Hotline: 988 or 539 667 3577 [place] Crisis Line: 458 569 7915 Crisis Recovery in [place]: [phone]     These are available 24 hours a day, 7 days a week.

## 2024-07-20 ENCOUNTER — Other Ambulatory Visit: Payer: Self-pay | Admitting: *Deleted

## 2024-07-20 DIAGNOSIS — F332 Major depressive disorder, recurrent severe without psychotic features: Secondary | ICD-10-CM

## 2024-07-20 DIAGNOSIS — R4586 Emotional lability: Secondary | ICD-10-CM

## 2024-07-20 DIAGNOSIS — F411 Generalized anxiety disorder: Secondary | ICD-10-CM

## 2024-07-22 ENCOUNTER — Other Ambulatory Visit: Payer: Self-pay | Admitting: *Deleted

## 2024-07-22 DIAGNOSIS — F411 Generalized anxiety disorder: Secondary | ICD-10-CM

## 2024-07-22 DIAGNOSIS — F332 Major depressive disorder, recurrent severe without psychotic features: Secondary | ICD-10-CM

## 2024-07-25 ENCOUNTER — Telehealth (INDEPENDENT_AMBULATORY_CARE_PROVIDER_SITE_OTHER): Payer: Self-pay | Admitting: Professional Counselor

## 2024-07-25 DIAGNOSIS — F411 Generalized anxiety disorder: Secondary | ICD-10-CM

## 2024-07-25 NOTE — BH Specialist Note (Signed)
 Virtual Behavioral Health Treatment Plan Team Note  MRN: [id number] NAME: Dillon Brown  DATE: 07/25/24  Start time: Start Time: 1110 End time: Stop Time: 1120 Total time: Total Time in Minutes (Visit): 10  Total number of Virtual BH Treatment Team Plan encounters: 1/4  Treatment Team Attendees: Carter Becker and Redell Corn   Collaborative Care Psychiatric Consultant Case Review   Assessment/Provisional Diagnosis 43 year old male with history of back pain, chronic left shoulder pain, over weight, and nicotine dependency. The patient is referred for anxiety and depression.   Provisional Diagnosis: # MDD, Recurrent, severe, without psychosis # GAD     Recommendation 1. Recommend increasing his Lexapro  to 15 mg dail with the option to increase to 20 mg if needed and discontinuing Vraylar  2. Recommend gabapentin 100 mg TID PRN anxiety. 3. BH specialist to follow up.   Thank you for your consult. Please contact our collaborative care team for any questions or concerns.   I spent 20 minutes chart reviewing, discussing with [facility] Speicalist and documenting in the chart.  Diagnoses:    ICD-10-CM   1. GAD (generalized anxiety disorder)  F41.1       Goals, Interventions and Follow-up Plan Goals: Increase healthy adjustment to current life circumstances Interventions: CBT Cognitive Behavioral Therapy Medication Management Recommendations: Recommend increasing his Lexapro  to 15 mg dail with the option to increase to 20 mg if needed and discontinuing Vraylar  2. Recommend gabapentin 100 mg TID PRN anxiety. Follow-up Plan:  Follow up bi-weekly  History of the present illness Presenting Problem/Current Symptoms:  The patient is a 43 year old male who presented today for a collaborative care assessment. His primary concerns include excessive sleep, difficulty falling asleep, and ongoing fatigue that makes it hard for him to get up and be productive. He reports elevated stress related to  caregiving responsibilities for his father, feeling consistently "on edge," low motivation, reduced interest in activities, irritability, and being more "snappy" with his wife. He also expresses a sense of guilt and the feeling that he "could be doing more."   The patient reports that childhood memories have been resurfacing lately. He describes a difficult and unstable upbringing characterized by frequent moves, parental substance use, and exposure to chaotic environments. These experiences seem to be contributing to current emotional distress and possible PTSD-related symptoms.   Regarding safety, the patient endorsed historical suicidal ideation and reports fleeting thoughts recently such as "maybe life would be easier if I wasn't here" or "my family would be better off without me." He denies any plan or intent, and denies current suicidal thoughts over the past one to two weeks. He has a past history of more concerning ideation during a period of heavy substance use a few years ago, including thoughts involving a firearm. He no longer owns firearms, having sold them to reduce access. He has never been hospitalized or attempted suicide.   The patient is currently taking Lexapro , which he reports has been helpful. He also uses [employer]. He has a significant substance use history, including prior heavy alcohol use (up to a 12-pack per day) and past use of narcotics, cocaine, and other substances. He has been over a year abstinent from narcotics. Currently, he drinks approximately 2-3 beers per night and smokes THC daily. While he feels he is "maintaining well" in this area, the combination of alcohol, daily THC use, and psychiatric medication presents potential risk and will require monitoring.   Overall, the patient is experiencing notable stress, sleep disturbance, mood symptoms,  and unresolved trauma, compounded by caregiver strain and a complex substance use history. We will present this case for case  consultation to determine the most appropriate plan of care and whether enhanced services, medication adjustments, or focused trauma-informed therapy are indicated.  Screenings PHQ-9 Assessments:     07/15/2024    1:09 PM 06/24/2024    3:55 PM 05/13/2024    3:14 PM  Depression screen PHQ 2/9  Decreased Interest 2 3 2   Down, Depressed, Hopeless 3 3 1   PHQ - 2 Score 5 6 3   Altered sleeping 3 3 3   Tired, decreased energy 3  3  Change in appetite 2 3 3   Feeling bad or failure about yourself  3 3 2   Trouble concentrating 3  3  Moving slowly or fidgety/restless 2 0 1  Suicidal thoughts 1 2 1   PHQ-9 Score 22 17  19    Difficult doing work/chores Very difficult Extremely dIfficult Extremely dIfficult     Data saved with a previous flowsheet row definition   GAD-7 Assessments:     06/24/2024    3:56 PM 05/13/2024    3:14 PM  GAD 7 : Generalized Anxiety Score  Nervous, Anxious, on Edge 3 3  Control/stop worrying 3 3  Worry too much - different things 3 3  Trouble relaxing 3 1  Restless 3 2  Easily annoyed or irritable 3 3  Afraid - awful might happen 2 1  Total GAD 7 Score 20 16  Anxiety Difficulty Very difficult Extremely difficult    Past Medical History Past Medical History:  Diagnosis Date   Back pain     Vital signs: There were no vitals filed for this visit.  Allergies:  Allergies as of 07/25/2024 - Review Complete 06/24/2024  Allergen Reaction Noted   Codeine      Medication History Current medications:  Outpatient Encounter Medications as of 07/25/2024  Medication Sig   atorvastatin  (LIPITOR) 10 MG tablet Take 1 tablet (10 mg total) by mouth daily.   escitalopram  (LEXAPRO ) 10 MG tablet Take 1 tablet by mouth once daily   predniSONE  (DELTASONE ) 10 MG tablet Take 1 tablet (10 mg total) by mouth daily with breakfast. (Patient not taking: Reported on 06/24/2024)   tizanidine  (ZANAFLEX ) 2 MG capsule Take 1 capsule (2 mg total) by mouth 3 (three) times daily as  needed for muscle spasms.   Vitamin D , Ergocalciferol , (DRISDOL ) 1.25 MG (50000 UNIT) CAPS capsule Take 1 capsule (50,000 Units total) by mouth every 7 (seven) days.   VRAYLAR  1.5 MG capsule Take 1 capsule by mouth once daily   No facility-administered encounter medications on file as of 07/25/2024.     Scribe for Treatment Team: Redell JINNY Corn

## 2024-07-28 ENCOUNTER — Encounter: Payer: Self-pay | Admitting: Nurse Practitioner

## 2024-07-28 ENCOUNTER — Ambulatory Visit: Payer: Self-pay | Admitting: Nurse Practitioner

## 2024-07-28 VITALS — BP 109/70 | HR 76 | Temp 97.5°F | Ht 73.0 in | Wt 217.4 lb

## 2024-07-28 DIAGNOSIS — T50905A Adverse effect of unspecified drugs, medicaments and biological substances, initial encounter: Secondary | ICD-10-CM | POA: Diagnosis not present

## 2024-07-28 DIAGNOSIS — F332 Major depressive disorder, recurrent severe without psychotic features: Secondary | ICD-10-CM

## 2024-07-28 DIAGNOSIS — R4586 Emotional lability: Secondary | ICD-10-CM

## 2024-07-28 DIAGNOSIS — G2571 Drug induced akathisia: Secondary | ICD-10-CM

## 2024-07-28 DIAGNOSIS — Z23 Encounter for immunization: Secondary | ICD-10-CM | POA: Diagnosis not present

## 2024-07-28 DIAGNOSIS — E559 Vitamin D deficiency, unspecified: Secondary | ICD-10-CM | POA: Insufficient documentation

## 2024-07-28 DIAGNOSIS — F411 Generalized anxiety disorder: Secondary | ICD-10-CM

## 2024-07-28 MED ORDER — ESCITALOPRAM OXALATE 10 MG PO TABS: 10.0000 mg | ORAL_TABLET | Freq: Every day | ORAL | 0 refills | Status: DC

## 2024-07-28 NOTE — Progress Notes (Signed)
 Subjective:  Patient ID: Dillon Brown, male    DOB: [birth year], 43 y.o.   MRN: [id number]  Patient Care Team: Deitra Morton Hummer, Nena, NP as PCP - General (Nurse Practitioner) Alvan Dorn FALCON, MD as PCP - Cardiology (Cardiology) Patient, No Pcp Per (General Practice)   Chief Complaint:  Medical Management of Chronic Issues (1 month - medication not working for him)   HPI: Dillon Brown is a 43 y.o. male presenting on 07/28/2024 for Medical Management of Chronic Issues (1 month - medication not working for him)   Discussed the use of AI scribe software for clinical note transcription with the patient, who gave verbal consent to proceed.  History of Present Illness Dillon Brown is a 43 year old male who presents with increased anxiety and restlessness.  He has experienced increased anxiety and restlessness since starting Vraylar  approximately one month ago. He describes feeling 'all over the place' and unable to focus, which has impacted his ability to work. He has been unable to maintain a consistent work schedule due to these symptoms, stating he had to put off work because he couldn't focus or 'give his money's worth.'  He reports significant sleep disturbances, including difficulty sleeping at night and excessive daytime sleepiness last week. This week, he has been unable to stay still, experiencing restlessness and frequent movement during the night, which has affected his partner and even his dog, who now sleeps on the couch.  His GAD-7 score decreased from 20 to 15, and his PHQ-9 score decreased from 20 to 18. He notes that the restlessness and anxiety have worsened since starting Vraylar , despite some improvement in other symptoms. He has been taking Lexapro  and Vraylar , but did not take Vraylar  today. He takes his medications within the same hour or two each day.  He mentions that the holiday season is not a source of stress for him, as he and his family keep  celebrations small. However, he acknowledges that his mother's birthday, who passed away in 14-Aug-2011, was earlier this week, but he feels he is in a better place regarding her passing.  He has been taking Zanaflex , but reports issues with insurance filling the prescription. He describes his back pain as 'about the same as it has been,' and has learned to manage it despite it being bothersome.  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 He has a flexible work schedule and does not have a full-time job. He drinks alcohol to relax during periods of increased anxiety.       07/28/2024    3:14 PM 07/15/2024    1:09 PM 06/24/2024    3:55 PM  PHQ9 SCORE ONLY  PHQ-9 Total Score 18 22 17        07/28/2024    3:14 PM 06/24/2024    3:56 PM 05/13/2024    3:14 PM  GAD 7 : Generalized Anxiety Score  Nervous, Anxious, on Edge 3 3 3   Control/stop worrying 1 3 3   Worry too much - different things 1 3 3   Trouble relaxing 3 3 1   Restless 3 3 2   Easily annoyed or irritable 3 3 3   Afraid - awful might happen 1 2 1   Total GAD 7 Score 15 20 16   Anxiety Difficulty Extremely difficult Very difficult Extremely difficult      Relevant past medical, surgical, family, and social history reviewed and updated as indicated.  Allergies and medications reviewed and updated.  Data reviewed: Chart in Epic.   Past Medical History:  Diagnosis Date   Back pain     Past Surgical History:  Procedure Laterality Date   ADENOIDECTOMY     FOREARM SURGERY     left-compound fracture   TYMPANOSTOMY TUBE PLACEMENT      Social History   Socioeconomic History   Marital status: Married    Spouse name: Not on file   Number of children: Not on file   Years of education: Not on file   Highest education level: Not on file  Occupational History   Not on file  Tobacco Use   Smoking status: Every Day    Current packs/day: 1.00    Average packs/day: 1 pack/day for  15.0 years (15.0 ttl pk-yrs)    Types: Cigarettes   Smokeless tobacco: Never  Substance and Sexual Activity   Alcohol use: Yes    Alcohol/week: 12.0 standard drinks of alcohol    Types: 12 Cans of beer per week   Drug use: No   Sexual activity: Yes    Birth control/protection: None  Other Topics Concern   Not on file  Social History Narrative   Not on file   Social Drivers of Health   Financial Resource Strain: Low Risk  (05/13/2024)   Overall Financial Resource Strain (CARDIA)    Difficulty of Paying Living Expenses: Not hard at all  Food Insecurity: No Food Insecurity (05/13/2024)   Hunger Vital Sign    Worried About Running Out of Food in the Last Year: Never true    Ran Out of Food in the Last Year: Never true  Transportation Needs: No Transportation Needs (05/13/2024)   PRAPARE - Administrator, Civil Service (Medical): No    Lack of Transportation (Non-Medical): No  Physical Activity: Not on file  Stress: Not on file  Social Connections: Not on file  Intimate Partner Violence: Not At Risk (05/13/2024)   Humiliation, Afraid, Rape, and Kick questionnaire    Fear of Current or Ex-Partner: No    Emotionally Abused: No    Physically Abused: No    Sexually Abused: No    Outpatient Encounter Medications as of 07/28/2024  Medication Sig   atorvastatin  (LIPITOR) 10 MG tablet Take 1 tablet (10 mg total) by mouth daily.   Vitamin D , Ergocalciferol , (DRISDOL ) 1.25 MG (50000 UNIT) CAPS capsule Take 1 capsule (50,000 Units total) by mouth every 7 (seven) days.   [DISCONTINUED] escitalopram  (LEXAPRO ) 10 MG tablet Take 1 tablet by mouth once daily   [DISCONTINUED] tizanidine  (ZANAFLEX ) 2 MG capsule Take 1 capsule (2 mg total) by mouth 3 (three) times daily as needed for muscle spasms.   [DISCONTINUED] VRAYLAR  1.5 MG capsule Take 1 capsule by mouth once daily   escitalopram  (LEXAPRO ) 10 MG tablet Take 1 tablet (10 mg total) by mouth daily.   [DISCONTINUED] predniSONE   (DELTASONE ) 10 MG tablet Take 1 tablet (10 mg total) by mouth daily with breakfast. (Patient not taking: Reported on 07/28/2024)   No facility-administered encounter medications on file as of 07/28/2024.    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 PATIENT IS NOT SURE WHETHER HE IS ALLERGIC TO THIS MEDICATION    Review of Systems  Constitutional:  Negative for chills and fever.  HENT:  Negative for sore throat.   Respiratory:  Negative for cough.   Cardiovascular:  Negative for chest pain and  leg swelling.  Gastrointestinal:  Negative for nausea and vomiting.  Skin:  Negative for itching and rash.  Neurological:  Negative for dizziness and headaches.  Psychiatric/Behavioral:  The patient has insomnia.        Fidgidy, unable to sit still, but is less         Objective:  BP 109/70   Pulse 76   Temp (!) 97.5 F (36.4 C) (Temporal)   Ht 6' 1 (1.854 m)   Wt 217 lb 6.4 oz (98.6 kg)   SpO2 94%   BMI 28.68 kg/m    Wt Readings from Last 3 Encounters:  07/28/24 217 lb 6.4 oz (98.6 kg)  06/24/24 208 lb 6.4 oz (94.5 kg)  05/13/24 212 lb 9.6 oz (96.4 kg)    Physical Exam Vitals and nursing note reviewed.  Constitutional:      Appearance: He is overweight.  HENT:     Head: Normocephalic and atraumatic.     Mouth/Throat:     Mouth: Mucous membranes are moist.  Eyes:     General: No scleral icterus.       Left eye: No discharge.     Conjunctiva/sclera: Conjunctivae normal.  Cardiovascular:     Heart sounds: Normal heart sounds.  Pulmonary:     Effort: Pulmonary effort is normal.     Breath sounds: Normal breath sounds.  Skin:    General: Skin is warm and dry.     Findings: No rash.  Neurological:     Mental Status: He is alert and oriented to person, place, and time.  Psychiatric:        Attention and Perception: Attention and perception normal.        Mood and Affect: Mood is anxious.         Speech: Speech normal.        Behavior: Behavior normal. Behavior is cooperative.        Thought Content: Thought content normal. Thought content does not include homicidal or suicidal ideation. Thought content does not include homicidal or suicidal plan.        Cognition and Memory: Cognition and memory normal.        Judgment: Judgment normal.    Physical Exam      Results for orders placed or performed in visit on 05/13/24  CBC with Differential/Platelet   Collection Time: 05/13/24  4:04 PM  Result Value Ref Range   WBC 7.6 3.4 - 10.8 x10E3/uL   RBC 5.04 4.14 - 5.80 x10E6/uL   Hemoglobin 16.1 13.0 - 17.7 g/dL   Hematocrit 52.8 62.4 - 51.0 %   MCV 94 79 - 97 fL   MCH 31.9 26.6 - 33.0 pg   MCHC 34.2 31.5 - 35.7 g/dL   RDW 87.0 88.3 - 84.5 %   Platelets 263 150 - 450 x10E3/uL   Neutrophils 63 Not Estab. %   Lymphs 22 Not Estab. %   Monocytes 8 Not Estab. %   Eos 6 Not Estab. %   Basos 1 Not Estab. %   Neutrophils Absolute 4.9 1.4 - 7.0 x10E3/uL   Lymphocytes Absolute 1.7 0.7 - 3.1 x10E3/uL   Monocytes Absolute 0.6 0.1 - 0.9 x10E3/uL   EOS (ABSOLUTE) 0.5 (H) 0.0 - 0.4 x10E3/uL   Basophils Absolute 0.1 0.0 - 0.2 x10E3/uL   Immature Granulocytes 0 Not Estab. %   Immature Grans (Abs) 0.0 0.0 - 0.1 x10E3/uL  CMP14+EGFR   Collection Time: 05/13/24  4:04 PM  Result Value Ref Range  Glucose 85 70 - 99 mg/dL   BUN 11 6 - 24 mg/dL   Creatinine, Ser 8.96 0.76 - 1.27 mg/dL   eGFR 92 >40 fO/fpw/8.26   BUN/Creatinine Ratio 11 9 - 20   Sodium 138 134 - 144 mmol/L   Potassium 4.5 3.5 - 5.2 mmol/L   Chloride 103 96 - 106 mmol/L   CO2 23 20 - 29 mmol/L   Calcium  9.1 8.7 - 10.2 mg/dL   Total Protein 6.8 6.0 - 8.5 g/dL   Albumin 4.5 4.1 - 5.1 g/dL   Globulin, Total 2.3 1.5 - 4.5 g/dL   Bilirubin Total 0.4 0.0 - 1.2 mg/dL   Alkaline Phosphatase 89 44 - 121 IU/L   AST 15 0 - 40 IU/L   ALT 16 0 - 44 IU/L  HepB+HepC+HIV Panel   Collection Time: 05/13/24  4:04 PM  Result Value Ref  Range   Hepatitis B Surface Ag Negative Negative   Hep B E Ag Negative Negative   Hep B C IgM Negative Negative   Hep B Core Total Ab Negative Negative   Hep B E Ab Non Reactive Negative   Hep B Surface Ab, Qual Non Reactive    Hep C Virus Ab Non Reactive Non Reactive   HIV Screen 4th Generation wRfx Non Reactive Non Reactive  Lipid panel   Collection Time: 05/13/24  4:04 PM  Result Value Ref Range   Cholesterol, Total 238 (H) 100 - 199 mg/dL   Triglycerides 813 (H) 0 - 149 mg/dL   HDL 40 >60 mg/dL   VLDL Cholesterol Cal 34 5 - 40 mg/dL   LDL Chol Calc (NIH) 835 (H) 0 - 99 mg/dL   Chol/HDL Ratio 6.0 (H) 0.0 - 5.0 ratio  Thyroid  Panel With TSH   Collection Time: 05/13/24  4:04 PM  Result Value Ref Range   TSH 1.380 0.450 - 4.500 uIU/mL   T4, Total 6.9 4.5 - 12.0 ug/dL   T3 Uptake Ratio 29 24 - 39 %   Free Thyroxine Index 2.0 1.2 - 4.9  VITAMIN D  25 Hydroxy (Vit-D Deficiency, Fractures)   Collection Time: 05/13/24  4:04 PM  Result Value Ref Range   Vit D, 25-Hydroxy 17.2 (L) 30.0 - 100.0 ng/mL       Pertinent labs & imaging results that were available during my care of the patient were reviewed by me and considered in my medical decision making.  Assessment & Plan:  Dillon Brown was seen today for medical management of chronic issues.  Diagnoses and all orders for this visit:  Encounter for immunization -     Flu vaccine trivalent PF, 6mos and older(Flulaval,Afluria,Fluarix,Fluzone)  Mood swings  Severe episode of recurrent major depressive disorder, without psychotic features (HCC) -     escitalopram  (LEXAPRO ) 10 MG tablet; Take 1 tablet (10 mg total) by mouth daily.  GAD (generalized anxiety disorder) -     escitalopram  (LEXAPRO ) 10 MG tablet; Take 1 tablet (10 mg total) by mouth daily.  Acute medication-induced akathisia     Assessment and Plan Dillon Brown is a 43 year old Caucasian male seen today for chronic disease management, no acute distress Assessment &  Plan Major depressive disorder, recurrent, severe, without psychotic features PHQ-9 score decreased from 20 to 18. Vraylar  may be causing increased anxiety and restlessness. - Continue Lexapro  10 mg oral daily. - Refilled Lexapro  prescription.  Generalized anxiety disorder GAD-7 score decreased from 20 to 15. Anxiety worsened with Vraylar , likely due to side effects. Lexapro  continued  as it does not cause these side effects. - Continue Lexapro  10 mg oral daily. - Monitor anxiety symptoms and adjust treatment if necessary.  Drug-induced akathisia Restlessness and inability to stay still likely due to Vraylar . Symptoms consistent with drug-induced akathisia. DC Vraylar   Immunization: influenza vaccine Received influenza vaccine today.      Continue all other maintenance medications.  Follow up plan: Return in about 6 weeks (around 09/08/2024) for mental health meds adjustment.   Continue healthy lifestyle choices, including diet (rich in fruits, vegetables, and lean proteins, and low in salt and simple carbohydrates) and exercise (at least 30 minutes of moderate physical activity daily).  Educational handout given for    Clinical References  Feeling Very Unhappy, Dissatisfied, or Uneasy (Dysphoria): What to Do Dysphoria is when someone has very strong feelings of uneasiness, unhappiness, or dissatisfaction. It can also involve feeling sad, worried, or easily annoyed. A person may have symptoms of dysphoria because of many reasons. For example, it may be caused by stressful events or situations, even when the event or situation is expected or known.  If symptoms of dysphoria last longer than two weeks, it's important to talk to a health care professional because the symptoms may be caused by a mental health condition, such as an adjustment disorder, depression, or anxiety disorder. Follow these instructions at home:  Tips and recommendations The following tips may help with  managing feelings of dysphoria: Take care of yourself. Eat a healthy diet. Exercise regularly. For example, aim for about 20 minutes of physical activity every day. Make time to do things that you enjoy. Get enough sleep. Doing certain things can help with getting enough sleep. You can: Turn off screens an hour or so before you get ready for sleep. Have a comfortable temperature in the room for sleeping. Do your best to have a regular time for going to bed and waking up. Avoid using substances to cope, including alcohol, nicotine, or other drugs. Talk about your feelings with people you trust, like loved ones, family, or friends. Learn about things that can help with coping and reducing stress, such as yoga and meditation. Try to do them often. General instructions Take your medicines only as told. Check with your provider before taking any herbs or supplements. Where to find support Your provider. Loved ones, family, or trusted friends. Contact the [facility] HelpLine. Available Monday- Friday, 10 a.m. - 10 p.m. ET. Call 1-800-950-NAMI 629-279-7237). Text HelpLine to 575 697 9325. Email: helpline@[facility] .org. Where to find more information For information about living with mental health conditions visit: www.nami.org Contact a health care provider if: Your symptoms are not getting better or have lasted longer than 2 weeks. Your symptoms are getting worse. You start to have symptoms you haven't had before, and the symptoms worry you. Get help right away if: You feel like you may hurt yourself or others. You have thoughts about taking your own life. You have other thoughts or feelings that worry you. These symptoms may be an emergency. Take one of these steps right away: Go to your nearest emergency room. Call 911. Contact The 988 Suicide & Crisis Lifeline (24 hours a day, 7 days a week, free and confidential): Call or text 988. Chat online at chat.newsactor.se. For Veterans and their loved ones,  contact the [employer]: Call 988 and press 1. Text 289-116-4077. Chat online at veteranscrisisline.net This information is not intended to replace advice given to you by your health care provider. Make sure you discuss any  questions you have with your health care provider. Document Revised: 09/13/2023 Document Reviewed: 09/13/2023 Elsevier Patient Education  2025 Elsevier Inc. Influenza (Flu) Vaccine (Inactivated or Recombinant): What You Need to Know Many vaccine information statements are available in Spanish and other languages. See promoage.com.br. 1. Why get vaccinated? Influenza vaccine can prevent influenza (flu). Flu is a contagious disease that spreads around the [place]  every year, usually between October and May. Anyone can get the flu, but it is more dangerous for some people. Infants and young children, people 34 years and older, pregnant people, and people with certain health conditions or a weakened immune system are at greatest risk of flu complications. Pneumonia, bronchitis, sinus infections, and ear infections are examples of flu-related complications. If you have a medical condition, such as heart disease, cancer, or diabetes, flu can make it worse. Flu can cause fever and chills, sore throat, muscle aches, fatigue, cough, headache, and runny or stuffy nose. Some people may have vomiting and diarrhea, though this is more common in children than adults. In an average year, thousands of people in the [place]  die from flu, and many more are hospitalized. Flu vaccine prevents millions of illnesses and flu-related visits to the doctor each year. 2. Influenza vaccines CDC recommends everyone 6 months and older get vaccinated every flu season. Children 6 months through 54 years of age may need 2 doses during a single flu season. Everyone else needs only 1 dose each flu season. It takes about 2 weeks for protection to develop after vaccination. There are many flu  viruses, and they are always changing. Each year a new flu vaccine is made to protect against the influenza viruses believed to be likely to cause disease in the upcoming flu season. Even when the vaccine doesn't exactly match these viruses, it may still provide some protection. Influenza vaccine does not cause flu. Influenza vaccine may be given at the same time as other vaccines. 3. Talk with your health care provider Tell your vaccination provider if the person getting the vaccine: Has had an allergic reaction after a previous dose of influenza vaccine, or has any severe, life-threatening allergies Has ever had Guillain-Barr Syndrome (also called GBS) In some cases, your health care provider may decide to postpone influenza vaccination until a future visit. Influenza vaccine can be administered at any time during pregnancy. People who are or will be pregnant during influenza season should receive inactivated influenza vaccine. People with minor illnesses, such as a cold, may be vaccinated. People who are moderately or severely ill should usually wait until they recover before getting influenza vaccine. Your health care provider can give you more information. 4. Risks of a vaccine reaction Soreness, redness, and swelling where the shot is given, fever, muscle aches, and headache can happen after influenza vaccination. There may be a very small increased risk of Guillain-Barr Syndrome (GBS) after inactivated influenza vaccine (the flu shot). Young children who get the flu shot along with pneumococcal vaccine (PCV13) and/or DTaP vaccine at the same time might be slightly more likely to have a seizure caused by fever. Tell your health care provider if a child who is getting flu vaccine has ever had a seizure. People sometimes faint after medical procedures, including vaccination. Tell your provider if you feel dizzy or have vision changes or ringing in the ears. As with any medicine, there is a  very remote chance of a vaccine causing a severe allergic reaction, other serious injury, or death. 5. What if  there is a serious problem? An allergic reaction could occur after the vaccinated person leaves the clinic. If you see signs of a severe allergic reaction (hives, swelling of the face and throat, difficulty breathing, a fast heartbeat, dizziness, or weakness), call 9-1-1 and get the person to the nearest hospital. For other signs that concern you, call your health care provider. Adverse reactions should be reported to the Vaccine Adverse Event Reporting System (VAERS). Your health care provider will usually file this report, or you can do it yourself. Visit the VAERS website at www.vaers.lagents.no or call 604-751-7250. VAERS is only for reporting reactions, and VAERS staff members do not give medical advice. 6. The National Vaccine Injury Compensation Program The [employer] Vaccine Injury Compensation Program (VICP) is a federal program that was created to compensate people who may have been injured by certain vaccines. Claims regarding alleged injury or death due to vaccination have a time limit for filing, which may be as short as two years. Visit the VICP website at spiritualword.at or call 615-253-0286 to learn about the program and about filing a claim. 7. How can I learn more? Ask your health care provider. Call your local or state health department. Visit the website of the Food and Drug Administration (FDA) for vaccine package inserts and additional information at finderlist.no. Contact the Centers for Disease Control and Prevention (CDC): Call (516)273-7888 (1-800-CDC-INFO) or Visit CDC's website at biotechroom.com.cy. Source: CDC Vaccine Information Statement Inactivated Influenza Vaccine (04/09/2020) This same material is available at footballexhibition.com.br for no charge. This information is not intended to replace advice given to you by your  health care provider. Make sure you discuss any questions you have with your health care provider. Document Revised: 12/06/2022 Document Reviewed: 09/11/2022 Elsevier Patient Education  2024 Elsevier Inc. Managing Anxiety, Adult After being diagnosed with anxiety, you may be relieved to know why you have felt or behaved a certain way. You may also feel overwhelmed about the treatment ahead and what it will mean for your life. With care and support, you can manage your anxiety. How to manage lifestyle changes Understanding the difference between stress and anxiety Although stress can play a role in anxiety, it is not the same as anxiety. Stress is your body's reaction to life changes and events, both good and bad. Stress is often caused by something external, such as a deadline, test, or competition. It normally goes away after the event has ended and will last just a few hours. But, stress can be ongoing and can lead to more than just stress. Anxiety is caused by something internal, such as imagining a terrible outcome or worrying that something will go wrong that will greatly upset you. Anxiety often does not go away even after the event is over, and it can become a long-term (chronic) worry. Lowering stress and anxiety Talk with your health care provider or a counselor to learn more about lowering anxiety and stress. They may suggest tension-reduction techniques, such as: Music. Spend time creating or listening to music that you enjoy and that inspires you. Mindfulness-based meditation. Practice being aware of your normal breaths while not trying to control your breathing. It can be done while sitting or walking. Centering prayer. Focus on a word, phrase, or sacred image that means something to you and brings you peace. Deep breathing. Expand your stomach and inhale slowly through your nose. Hold your breath for 3-5 seconds. Then breathe out slowly, letting your stomach muscles relax. Self-talk.  Learn to notice and  spot thought patterns that lead to anxiety reactions. Change those patterns to thoughts that feel peaceful. Muscle relaxation. Take time to tense muscles and then relax them. Choose a tension-reduction technique that fits your lifestyle and personality. These techniques take time and practice. Set aside 5-15 minutes a day to do them. Specialized therapists can offer counseling and training in these techniques. The training to help with anxiety may be covered by some insurance plans. Other things you can do to manage stress and anxiety include: Keeping a stress diary. This can help you learn what triggers your reaction and then learn ways to manage your response. Thinking about how you react to certain situations. You may not be able to control everything, but you can control your response. Making time for activities that help you relax and not feeling guilty about spending your time in this way. Doing visual imagery. This involves imagining or creating mental pictures to help you relax. Practicing yoga. Through yoga poses, you can lower tension and relax.   Medicines Medicines for anxiety include: Antidepressant medicines. These are usually prescribed for long-term daily control. Anti-anxiety medicines. These may be added in severe cases, especially when panic attacks occur. When used together, medicines, psychotherapy, and tension-reduction techniques may be the most effective treatment. Relationships Relationships can play a big part in helping you recover. Spend more time connecting with trusted friends and family members. Think about going to couples counseling if you have a partner, taking family education classes, or going to family therapy. Therapy can help you and others better understand your anxiety. How to recognize changes in your anxiety Everyone responds differently to treatment for anxiety. Recovery from anxiety happens when symptoms lessen and stop interfering  with your daily life at home or work. This may mean that you will start to: Have better concentration and focus. Worry will interfere less in your daily thinking. Sleep better. Be less irritable. Have more energy. Have improved memory. Try to recognize when your condition is getting worse. Contact your provider if your symptoms interfere with home or work and you feel like your condition is not improving. Follow these instructions at home: Activity Exercise. Adults should: Exercise for at least 150 minutes each week. The exercise should increase your heart rate and make you sweat (moderate-intensity exercise). Do strengthening exercises at least twice a week. Get the right amount and quality of sleep. Most adults need 7-9 hours of sleep each night. Lifestyle  Eat a healthy diet that includes plenty of vegetables, fruits, whole grains, low-fat dairy products, and lean protein. Do not eat a lot of foods that are high in fats, added sugars, or salt (sodium). Make choices that simplify your life. Do not use any products that contain nicotine or tobacco. These products include cigarettes, chewing tobacco, and vaping devices, such as e-cigarettes. If you need help quitting, ask your provider. Avoid caffeine, alcohol, and certain over-the-counter cold medicines. These may make you feel worse. Ask your pharmacist which medicines to avoid. General instructions Take over-the-counter and prescription medicines only as told by your provider. Keep all follow-up visits. This is to make sure you are managing your anxiety well or if you need more support. Where to find support You can get help and support from: Self-help groups. Online and [employer]. A trusted spiritual leader. Couples counseling. Family education classes. Family therapy. Where to find more information You may find that joining a support group helps you deal with your anxiety. The following sources can help you find  counselors or support groups near you: Mental Health America: mentalhealthamerica.net Anxiety and Depression Association of America (ADAA): adaa.org The First American on Mental Illness (NAMI): nami.org Contact a health care provider if: You have a hard time staying focused or finishing tasks. You spend many hours a day feeling worried about everyday life. You are very tired because you cannot stop worrying. You start to have headaches or often feel tense. You have chronic nausea or diarrhea. Get help right away if: Your heart feels like it is racing. You have shortness of breath. You have thoughts of hurting yourself or others. Get help right away if you feel like you may hurt yourself or others, or have thoughts about taking your own life. Go to your nearest emergency room or: Call 911. Call the National Suicide Prevention Lifeline at (670) 135-4105 or 988. This is open 24 hours a day. Text the Crisis Text Line at 520 016 2295. This information is not intended to replace advice given to you by your health care provider. Make sure you discuss any questions you have with your health care provider. Document Revised: 05/30/2022 Document Reviewed: 12/12/2020 Elsevier Patient Education  2024 [employer]. Managing Depression, Adult Depression is a mental health condition that affects your thoughts, feelings, and actions. Being diagnosed with depression can bring you relief if you did not know why you have felt or behaved a certain way. It could also leave you feeling overwhelmed. Finding ways to manage your symptoms can help you feel more positive about your future. How to manage lifestyle changes Being depressed is difficult. Depression can increase the level of everyday stress. Stress can make depression symptoms worse. You may believe your symptoms cannot be managed or will never improve. However, there are many things you can try to help manage your symptoms. There is hope. Managing  stress  Stress is your body's reaction to life changes and events, both good and bad. Stress can add to your feelings of depression. Learning to manage your stress can help lessen your feelings of depression. Try some of the following approaches to reducing your stress (stress reduction techniques): Listen to music that you enjoy and that inspires you. Try using a meditation app or take a meditation class. Develop a practice that helps you connect with your spiritual self. Walk in nature, pray, or go to a place of worship. Practice deep breathing. To do this, inhale slowly through your nose. Pause at the top of your inhale for a few seconds and then exhale slowly, letting yourself relax. Repeat this three or four times. Practice yoga to help relax and work your muscles. Choose a stress reduction technique that works for you. These techniques take time and practice to develop. Set aside 5-15 minutes a day to do them. Therapists can offer training in these techniques. Do these things to help manage stress: Keep a journal. Know your limits. Set healthy boundaries for yourself and others, such as saying no when you think something is too much. Pay attention to how you react to certain situations. You may not be able to control everything, but you can change your reaction. Add humor to your life by watching funny movies or shows. Make time for activities that you enjoy and that relax you. Spend less time using electronics, especially at night before bed. The light from screens can make your brain think it is time to get up rather than go to bed.   Medicines Medicines, such as antidepressants, are often a part of treatment for depression.  Talk with your pharmacist or health care provider about all the medicines, supplements, and herbal products that you take, their possible side effects, and what medicines and other products are safe to take together. Make sure to report any side effects you may have  to your health care provider. Relationships Your health care provider may suggest family therapy, couples therapy, or individual therapy as part of your treatment. How to recognize changes Everyone responds differently to treatment for depression. As you recover from depression, you may start to: Have more interest in doing activities. Feel more hopeful. Have more energy. Eat a more regular amount of food. Have better mental focus. It is important to recognize if your depression is not getting better or is getting worse. The symptoms you had in the beginning may return, such as: Feeling tired. Eating too much or too little. Sleeping too much or too little. Feeling restless, agitated, or hopeless. Trouble focusing or making decisions. Having unexplained aches and pains. Feeling irritable, angry, or aggressive. If you or your family members notice these symptoms coming back, let your health care provider know right away. Follow these instructions at home: Activity Try to get some form of exercise each day, such as walking. Try yoga, mindfulness, or other stress reduction techniques. Participate in group activities if you are able. Lifestyle Get enough sleep. Cut down on or stop using caffeine, tobacco, alcohol, and any other harmful substances. Eat a healthy diet that includes plenty of vegetables, fruits, whole grains, low-fat dairy products, and lean protein. Limit foods that are high in solid fats, added sugar, or salt (sodium). General instructions Take over-the-counter and prescription medicines only as told by your health care provider. Keep all follow-up visits. It is important for your health care provider to check on your mood, behavior, and medicines. Your health care provider may need to make changes to your treatment. Where to find support Talking to others  Friends and family members can be sources of support and guidance. Talk to trusted friends or family members about  your condition. Explain your symptoms and let them know that you are working with a health care provider to treat your depression. Tell friends and family how they can help. Finances Find mental health providers that fit with your financial situation. Talk with your health care provider if you are worried about access to food, housing, or medicine. Call your insurance company to learn about your co-pays and prescription plan. Where to find more information You can find support in your area from: Anxiety and Depression Association of America (ADAA): adaa.org Mental Health America: mentalhealthamerica.net The First American on Mental Illness: nami.org Contact a health care provider if: You stop taking your antidepressant medicines, and you have any of these symptoms: Nausea. Headache. Light-headedness. Chills and body aches. Not being able to sleep (insomnia). You or your friends and family think your depression is getting worse. Get help right away if: You have thoughts of hurting yourself or others. Get help right away if you feel like you may hurt yourself or others, or have thoughts about taking your own life. Go to your nearest emergency room or: Call 911. Call the National Suicide Prevention Lifeline at 226-639-6451 or 988. This is open 24 hours a day. Text the Crisis Text Line at (484) 055-9257. This information is not intended to replace advice given to you by your health care provider. Make sure you discuss any questions you have with your health care provider. Document Revised: 12/27/2021 Document Reviewed: 12/27/2021 Elsevier Patient  Education  2024 [employer].  The above assessment and management plan was discussed with the patient. The patient verbalized understanding of and has agreed to the management plan. Patient is aware to call the clinic if they develop any new symptoms or if symptoms persist or worsen. Patient is aware when to return to the clinic for a follow-up visit.  Patient educated on when it is appropriate to go to the emergency department.   Mohid Furuya [place] Thompson, DNP [facility] [address] [place], [place] [address] [phone]

## 2024-07-29 ENCOUNTER — Ambulatory Visit: Admitting: Professional Counselor

## 2024-07-29 DIAGNOSIS — F332 Major depressive disorder, recurrent severe without psychotic features: Secondary | ICD-10-CM

## 2024-07-29 NOTE — BH Specialist Note (Unsigned)
 [place] Follow-up  MRN: [id number] NAME: Dillon Brown Date: 08/08/24  Start time: Start Time: 0100 End time: Stop Time: 0130 Total time: Total Time in Minutes (Visit): 30 Call number: Visit Number: 3- Third Visit  Reason for call today:  The patient is a 43 year old male who presented for a collaborative care follow-up. He reports improvement in his overall mood and states that he has been feeling "a little bit better." He notes that his medications have been helpful, and he finds the counseling sessions beneficial. He has been making intentional changes in his daily routines and appears motivated to continue working toward further improvement. Slight but noticeable progress has already been observed, which is encouraging.  The plan is to continue providing brief therapeutic interventions and ongoing medication monitoring to support continued symptom reduction and stability. A follow-up appointment has been scheduled to reassess progress and adjust the care plan as needed.  PHQ-9 Scores:     07/28/2024    3:14 PM 07/15/2024    1:09 PM 06/24/2024    3:55 PM 05/13/2024    3:14 PM  Depression screen PHQ 2/9  Decreased Interest 2 2 3 2   Down, Depressed, Hopeless 2 3 3 1   PHQ - 2 Score 4 5 6 3   Altered sleeping 3 3 3 3   Tired, decreased energy 1 3  3   Change in appetite 2 2 3 3   Feeling bad or failure about yourself  2 3 3 2   Trouble concentrating 3 3  3   Moving slowly or fidgety/restless 3 2 0 1  Suicidal thoughts 0 1 2 1   PHQ-9 Score 18 22 17  19    Difficult doing work/chores Extremely dIfficult Very difficult Extremely dIfficult Extremely dIfficult     Data saved with a previous flowsheet row definition   GAD-7 Scores:     07/28/2024    3:14 PM 06/24/2024    3:56 PM 05/13/2024    3:14 PM  GAD 7 : Generalized Anxiety Score  Nervous, Anxious, on Edge 3 3 3   Control/stop worrying 1 3 3   Worry too much - different things 1 3 3   Trouble relaxing 3 3 1   Restless 3 3 2    Easily annoyed or irritable 3 3 3   Afraid - awful might happen 1 2 1   Total GAD 7 Score 15 20 16   Anxiety Difficulty Extremely difficult Very difficult Extremely difficult    Stress Current stressors:  work,  Sleep:  poor Appetite:  fair Coping ability:  good Patient taking medications as prescribed: Yes   Current medications:  Outpatient Encounter Medications as of 07/29/2024  Medication Sig   atorvastatin  (LIPITOR) 10 MG tablet Take 1 tablet (10 mg total) by mouth daily.   escitalopram  (LEXAPRO ) 10 MG tablet Take 1 tablet (10 mg total) by mouth daily.   Vitamin D , Ergocalciferol , (DRISDOL ) 1.25 MG (50000 UNIT) CAPS capsule Take 1 capsule (50,000 Units total) by mouth every 7 (seven) days.   No facility-administered encounter medications on file as of 07/29/2024.     Self-harm Behaviors Risk Assessment Self-harm risk factors:  past thoughts  Patient endorses recent thoughts of harming self:  Denies   Danger to Others Risk Assessment Danger to others risk factors:  none Patient endorses recent thoughts of harming others:  Denies   Substance Use Assessment Patient recently consumed alcohol:  Yes  Alcohol Use Disorder Identification Test (AUDIT):     05/13/2024    3:23 PM  Alcohol Use Disorder Test (AUDIT)  1. How  often do you have a drink containing alcohol? 4  2. How many drinks containing alcohol do you have on a typical day when you are drinking? 2  3. How often do you have six or more drinks on one occasion? 4  AUDIT-C Score 10  4. How often during the last year have you found that you were not able to stop drinking once you had started? 0  5. How often during the last year have you failed to do what was normally expected from you because of drinking? 1  6. How often during the last year have you needed a first drink in the morning to get yourself going after a heavy drinking session? 0  7. How often during the last year have you had a feeling of guilt of remorse after  drinking? 0  8. How often during the last year have you been unable to remember what happened the night before because you had been drinking? 0  9. Have you or someone else been injured as a result of your drinking? 0  10. Has a relative or friend or a doctor or another health worker been concerned about your drinking or suggested you cut down? 0  Alcohol Use Disorder Identification Test Final Score (AUDIT) 11   Patient recently used drugs:    Opioid Risk Assessment:    Goals, Interventions and Follow-up Plan Goals: Increase healthy adjustment to current life circumstances Interventions: Behavioral Activation and CBT Cognitive Behavioral Therapy Follow-up Plan: 2 weks    Redell JINNY Corn

## 2024-08-12 ENCOUNTER — Ambulatory Visit (INDEPENDENT_AMBULATORY_CARE_PROVIDER_SITE_OTHER): Admitting: Professional Counselor

## 2024-08-12 ENCOUNTER — Other Ambulatory Visit: Payer: Self-pay | Admitting: Nurse Practitioner

## 2024-08-12 DIAGNOSIS — F332 Major depressive disorder, recurrent severe without psychotic features: Secondary | ICD-10-CM

## 2024-08-12 NOTE — BH Specialist Note (Unsigned)
 [place] [facility] Telephone Follow-up  MRN: [id number] NAME: Dillon Brown Date: 08/12/24  Start time: Start Time: 1130 End time: Stop Time: 1200 Total time: Total Time in Minutes (Visit): 30 Call number: Visit Number: 4- Fourth Visit  Reason for call today:  stress   PHQ-9 Scores:     08/12/2024   11:46 AM 07/28/2024    3:14 PM 07/15/2024    1:09 PM 06/24/2024    3:55 PM 05/13/2024    3:14 PM  Depression screen PHQ 2/9  Decreased Interest 1 2 2 3 2   Down, Depressed, Hopeless 1 2 3 3 1   PHQ - 2 Score 2 4 5 6 3   Altered sleeping 1 3 3 3 3   Tired, decreased energy 1 1 3  3   Change in appetite 1 2 2 3 3   Feeling bad or failure about yourself  1 2 3 3 2   Trouble concentrating 2 3 3  3   Moving slowly or fidgety/restless 1 3 2  0 1  Suicidal thoughts 0 0 1 2 1   PHQ-9 Score 9 18 22 17  19    Difficult doing work/chores Somewhat difficult Extremely dIfficult Very difficult Extremely dIfficult Extremely dIfficult     Data saved with a previous flowsheet row definition   GAD-7 Scores:     08/12/2024   11:49 AM 07/28/2024    3:14 PM 06/24/2024    3:56 PM 05/13/2024    3:14 PM  GAD 7 : Generalized Anxiety Score  Nervous, Anxious, on Edge 2 3 3 3   Control/stop worrying 2 1 3 3   Worry too much - different things 2 1 3 3   Trouble relaxing 1 3 3 1   Restless 1 3 3 2   Easily annoyed or irritable 1 3 3 3   Afraid - awful might happen 1 1 2 1   Total GAD 7 Score 10 15 20 16   Anxiety Difficulty Very difficult Extremely difficult Very difficult Extremely difficult    Stress Current stressors:  lack of structure  Sleep:  improved Appetite:  improved Coping ability:  fair Patient taking medications as prescribed:  yes  Current medications:  Outpatient Encounter Medications as of 08/12/2024  Medication Sig   atorvastatin  (LIPITOR) 10 MG tablet Take 1 tablet (10 mg total) by mouth daily.   escitalopram  (LEXAPRO ) 10 MG tablet Take 1 tablet (10 mg total) by mouth daily.   Vitamin D ,  Ergocalciferol , (DRISDOL ) 1.25 MG (50000 UNIT) CAPS capsule Take 1 capsule (50,000 Units total) by mouth every 7 (seven) days.   No facility-administered encounter medications on file as of 08/12/2024.    Self-harm Behaviors Risk Assessment Self-harm risk factors:  Past thoughts Patient endorses recent thoughts of harming self:  Denies   Danger to Others Risk Assessment Danger to others risk factors:  none Patient endorses recent thoughts of harming others:  denies   Substance Use Assessment Patient recently consumed alcohol:  none  Alcohol Use Disorder Identification Test (AUDIT):     05/13/2024    3:23 PM  Alcohol Use Disorder Test (AUDIT)  1. How often do you have a drink containing alcohol? 4  2. How many drinks containing alcohol do you have on a typical day when you are drinking? 2  3. How often do you have six or more drinks on one occasion? 4  AUDIT-C Score 10  4. How often during the last year have you found that you were not able to stop drinking once you had started? 0  5. How often  during the last year have you failed to do what was normally expected from you because of drinking? 1  6. How often during the last year have you needed a first drink in the morning to get yourself going after a heavy drinking session? 0  7. How often during the last year have you had a feeling of guilt of remorse after drinking? 0  8. How often during the last year have you been unable to remember what happened the night before because you had been drinking? 0  9. Have you or someone else been injured as a result of your drinking? 0  10. Has a relative or friend or a doctor or another health worker been concerned about your drinking or suggested you cut down? 0  Alcohol Use Disorder Identification Test Final Score (AUDIT) 11    Goals, Interventions and Follow-up Plan Goals: Increase healthy adjustment to current life circumstances Interventions: CBT Cognitive Behavioral Therapy Follow-up  Plan:   Summary:   Dillon Brown

## 2024-08-15 DIAGNOSIS — Z419 Encounter for procedure for purposes other than remedying health state, unspecified: Secondary | ICD-10-CM | POA: Diagnosis not present

## 2024-08-16 ENCOUNTER — Other Ambulatory Visit: Payer: Self-pay | Admitting: Nurse Practitioner

## 2024-08-16 DIAGNOSIS — F332 Major depressive disorder, recurrent severe without psychotic features: Secondary | ICD-10-CM

## 2024-08-16 DIAGNOSIS — F411 Generalized anxiety disorder: Secondary | ICD-10-CM

## 2024-08-16 DIAGNOSIS — R4586 Emotional lability: Secondary | ICD-10-CM

## 2024-08-26 ENCOUNTER — Ambulatory Visit: Admitting: Professional Counselor

## 2024-08-26 DIAGNOSIS — F332 Major depressive disorder, recurrent severe without psychotic features: Secondary | ICD-10-CM

## 2024-08-26 NOTE — BH Specialist Note (Signed)
 [facility] Telephone Follow-up  MRN: [id number] NAME: Dillon Brown Date: 08/26/2024  Start time: Start Time: 1100 End time: Stop Time: 1115 Total time: Total Time in Minutes (Visit): 15 Call number: Visit Number: 5-Fifth Visit  Reason for call today:  The patient is a 43 year old male presenting for a collaborative care follow-up session. He presented in a relatively positive mood but continues to experience ongoing depressive and anxiety symptoms. He identified contributing factors including a lack of daily structure and limited physical activity, which continue to impact his overall functioning and mood regulation.  The patient reported plans to explore more stable employment opportunities in the coming year, which he views as a potential source of increased routine and purpose. He continues to struggle with core issues rooted in childhood experiences, including persistent negative self-talk and low self-worth. He expressed a desire to explore these deeper themes more thoroughly in therapy; however, the treatment team agreed that continued stabilization within collaborative care is appropriate prior to transitioning to traditional outpatient therapy.  The patients current medication regimen remains appropriate and effective, with no reported concerns or side effects. He continues to benefit from collaborative care, frequently reporting symptomatic relief and improved emotional clarity following sessions. Symptom monitoring reflects meaningful improvement over time, with PHQ-9 scores decreasing from 22 to 9, and GAD-7 scores improving from 15 to 10, though currently mildly elevated at 13. Overall, these scores indicate sustained progress despite residual symptoms.  The patient demonstrated ongoing motivation, insight, and willingness to engage in treatment. He remains receptive to interventions and actively participates in sessions. Given continued improvement and benefit from the  collaborative care model, ongoing follow-up is indicated prior to transition to traditional therapy.   PHQ-9 Scores:     08/26/2024   12:16 PM 08/12/2024   11:46 AM 07/28/2024    3:14 PM 07/15/2024    1:09 PM 06/24/2024    3:55 PM  Depression screen PHQ 2/9  Decreased Interest 1 1 2 2 3   Down, Depressed, Hopeless 1 1 2 3 3   PHQ - 2 Score 2 2 4 5 6   Altered sleeping 1 1 3 3 3   Tired, decreased energy 1 1 1 3    Change in appetite 1 1 2 2 3   Feeling bad or failure about yourself  1 1 2 3 3   Trouble concentrating 2 2 3 3    Moving slowly or fidgety/restless 1 1 3 2  0  Suicidal thoughts 0 0 0 1 2  PHQ-9 Score 9 9 18 22 17    Difficult doing work/chores Very difficult Somewhat difficult Extremely dIfficult Very difficult Extremely dIfficult     Data saved with a previous flowsheet row definition   GAD-7 Scores:     08/26/2024   12:17 PM 08/12/2024   11:49 AM 07/28/2024    3:14 PM 06/24/2024    3:56 PM  GAD 7 : Generalized Anxiety Score  Nervous, Anxious, on Edge 3 2 3 3   Control/stop worrying 2 2 1 3   Worry too much - different things 2 2 1 3   Trouble relaxing 2 1 3 3   Restless 2 1 3 3   Easily annoyed or irritable 1 1 3 3   Afraid - awful might happen 1 1 1 2   Total GAD 7 Score 13 10 15 20   Anxiety Difficulty Very difficult Very difficult Extremely difficult Very difficult    Stress Current stressors:  lack of structure Sleep:  fair Appetite:  good Coping ability:  good Patient taking medications as  prescribed:  Yes  Current medications:  Outpatient Encounter Medications as of 08/26/2024  Medication Sig   atorvastatin  (LIPITOR) 10 MG tablet Take 1 tablet by mouth once daily   escitalopram  (LEXAPRO ) 10 MG tablet Take 1 tablet (10 mg total) by mouth daily.   Vitamin D , Ergocalciferol , (DRISDOL ) 1.25 MG (50000 UNIT) CAPS capsule Take 1 capsule (50,000 Units total) by mouth every 7 (seven) days.   No facility-administered encounter medications on file as of 08/26/2024.      Self-harm Behaviors Risk Assessment Self-harm risk factors:  past thoughts Patient endorses recent thoughts of harming self:  Denies   Danger to Others Risk Assessment Danger to others risk factors:  None Patient endorses recent thoughts of harming others:  Denies    Substance Use Assessment Patient recently consumed alcohol:    Alcohol Use Disorder Identification Test (AUDIT):     05/13/2024    3:23 PM  Alcohol Use Disorder Test (AUDIT)  1. How often do you have a drink containing alcohol? 4  2. How many drinks containing alcohol do you have on a typical day when you are drinking? 2  3. How often do you have six or more drinks on one occasion? 4  AUDIT-C Score 10  4. How often during the last year have you found that you were not able to stop drinking once you had started? 0  5. How often during the last year have you failed to do what was normally expected from you because of drinking? 1  6. How often during the last year have you needed a first drink in the morning to get yourself going after a heavy drinking session? 0  7. How often during the last year have you had a feeling of guilt of remorse after drinking? 0  8. How often during the last year have you been unable to remember what happened the night before because you had been drinking? 0  9. Have you or someone else been injured as a result of your drinking? 0  10. Has a relative or friend or a doctor or another health worker been concerned about your drinking or suggested you cut down? 0  Alcohol Use Disorder Identification Test Final Score (AUDIT) 11     Goals, Interventions and Follow-up Plan Goals: Increase healthy adjustment to current life circumstances Interventions: Medication Monitoring and Supportive Counseling Follow-up Plan: Continue collaborative care follow-up Focus on increasing structure and behavioral activation, including physical activity Address negative self-talk using CBT-based  interventions Monitor PHQ-9 and GAD-7 scores Reassess readiness for transition to traditional therapy as stabilization continues     Redell JINNY Corn

## 2024-09-09 ENCOUNTER — Ambulatory Visit: Admitting: Nurse Practitioner

## 2024-09-09 ENCOUNTER — Encounter: Payer: Self-pay | Admitting: Nurse Practitioner

## 2024-09-09 VITALS — BP 107/73 | HR 101 | Temp 98.0°F | Ht 73.0 in | Wt 214.0 lb

## 2024-09-09 DIAGNOSIS — R4586 Emotional lability: Secondary | ICD-10-CM

## 2024-09-09 DIAGNOSIS — F332 Major depressive disorder, recurrent severe without psychotic features: Secondary | ICD-10-CM

## 2024-09-09 DIAGNOSIS — F411 Generalized anxiety disorder: Secondary | ICD-10-CM

## 2024-09-09 DIAGNOSIS — Z1211 Encounter for screening for malignant neoplasm of colon: Secondary | ICD-10-CM

## 2024-09-09 MED ORDER — MIRTAZAPINE 7.5 MG PO TABS: 7.5000 mg | ORAL_TABLET | Freq: Every day | ORAL | 0 refills | Status: AC

## 2024-09-09 MED ORDER — ESCITALOPRAM OXALATE 20 MG PO TABS: 20.0000 mg | ORAL_TABLET | Freq: Every day | ORAL | 0 refills | Status: AC

## 2024-09-09 NOTE — Progress Notes (Signed)
 "    Subjective:  Patient ID: Dillon Brown, male    DOB: [birth year], 44 y.o.   MRN: [id number]  Patient Care Team: Deitra Morton Hummer, Nena, NP as PCP - General (Nurse Practitioner) Alvan Dorn FALCON, MD as PCP - Cardiology (Cardiology) Patient, No Pcp Per (General Practice)   Chief Complaint:  mental health (No concerns at this time. ) and muscle relaxer (Would like a alternative. The one you called in is not covered.)   HPI: Dillon Brown is a 44 y.o. male presenting on 09/09/2024 for mental health (No concerns at this time. ) and muscle relaxer (Would like a alternative. The one you called in is not covered.)   Discussed the use of AI scribe software for clinical note transcription with the patient, who gave verbal consent to proceed.  History of Present Illness Dillon Brown is a 44 year old male who presents with anxiety and medication management.  He experiences increased anxiety, particularly in social situations such as being around other people or in grocery stores. He describes having panic attacks and difficulty controlling his anxiety. He recently started a new job but has struggled with anxiety in that setting as well. His anxiety has improved somewhat since starting Lexapro , but he still experiences significant symptoms.  He has been taking Lexapro , initially at 10 mg, which was increased to 15 mg. The increase helped, but he still feels that a higher dose might be beneficial. He was previously prescribed Vraylar  as a booster for Lexapro , but he experienced restless legs while taking it, and it was discontinued. He is currently taking Lexapro , vitamin D , and Lipitor.  He has a history of alcohol use and has been sober for two weeks. He mentions dreaming more since he stopped drinking, which sometimes disrupts his sleep. He typically gets about four hours of sleep per night. He also smokes nearly a pack of cigarettes a day, but his smoking decreases when he is not  drinking.  He has not had any outbursts or significant mood swings recently and feels that his mood is generally under control. He wants to return to regular work hours and improve his overall functioning.  He has not had a colonoscopy interested in Cologuard Cologuard    Relevant past medical, surgical, family, and social history reviewed and updated as indicated.  Allergies and medications reviewed and updated. Data reviewed: Chart in Epic.   Past Medical History:  Diagnosis Date   Back pain     Past Surgical History:  Procedure Laterality Date   ADENOIDECTOMY     FOREARM SURGERY     left-compound fracture   TYMPANOSTOMY TUBE PLACEMENT      Social History   Socioeconomic History   Marital status: Married    Spouse name: Not on file   Number of children: Not on file   Years of education: Not on file   Highest education level: Not on file  Occupational History   Not on file  Tobacco Use   Smoking status: Every Day    Current packs/day: 1.00    Average packs/day: 1 pack/day for 15.0 years (15.0 ttl pk-yrs)    Types: Cigarettes   Smokeless tobacco: Never  Substance and Sexual Activity   Alcohol use: Yes    Alcohol/week: 12.0 standard drinks of alcohol    Types: 12 Cans of beer per week   Drug use: No   Sexual activity: Yes    Birth control/protection: None  Other Topics Concern   Not on  file  Social History Narrative   Not on file   Social Drivers of Health   Tobacco Use: High Risk (09/09/2024)   Patient History    Smoking Tobacco Use: Every Day    Smokeless Tobacco Use: Never    Passive Exposure: Not on file  Financial Resource Strain: Low Risk (05/13/2024)   Overall Financial Resource Strain (CARDIA)    Difficulty of Paying Living Expenses: Not hard at all  Food Insecurity: No Food Insecurity (05/13/2024)   Epic    Worried About Programme Researcher, Broadcasting/film/video in the Last Year: Never true    Ran Out of Food in the Last Year: Never true  Transportation Needs: No  Transportation Needs (05/13/2024)   Epic    Lack of Transportation (Medical): No    Lack of Transportation (Non-Medical): No  Physical Activity: Not on file  Stress: Not on file  Social Connections: Not on file  Intimate Partner Violence: Not At Risk (05/13/2024)   Epic    Fear of Current or Ex-Partner: No    Emotionally Abused: No    Physically Abused: No    Sexually Abused: No  Depression (PHQ2-9): High Risk (09/09/2024)   Depression (PHQ2-9)    PHQ-2 Score: 17  Alcohol Screen: Medium Risk (05/13/2024)   Alcohol Screen    Last Alcohol Screening Score (AUDIT): 11  Housing: Not on file  Utilities: Not on file  Health Literacy: Not on file    Outpatient Encounter Medications as of 09/09/2024  Medication Sig   atorvastatin  (LIPITOR) 10 MG tablet Take 1 tablet by mouth once daily   escitalopram  (LEXAPRO ) 20 MG tablet Take 1 tablet (20 mg total) by mouth daily.   mirtazapine  (REMERON ) 7.5 MG tablet Take 1 tablet (7.5 mg total) by mouth at bedtime.   Vitamin D , Ergocalciferol , (DRISDOL ) 1.25 MG (50000 UNIT) CAPS capsule Take 1 capsule (50,000 Units total) by mouth every 7 (seven) days.   [DISCONTINUED] escitalopram  (LEXAPRO ) 10 MG tablet Take 1 tablet (10 mg total) by mouth daily.   No facility-administered encounter medications on file as of 09/09/2024.    Allergies[1]  Pertinent ROS per HPI, otherwise unremarkable      Objective:  BP 107/73   Pulse (!) 101   Temp 98 F (36.7 C)   Ht 6' 1 (1.854 m)   Wt 214 lb (97.1 kg)   SpO2 96%   BMI 28.23 kg/m    Wt Readings from Last 3 Encounters:  09/09/24 214 lb (97.1 kg)  07/28/24 217 lb 6.4 oz (98.6 kg)  06/24/24 208 lb 6.4 oz (94.5 kg)    Physical Exam Vitals and nursing note reviewed.  Constitutional:      General: He is not in acute distress. HENT:     Head: Normocephalic and atraumatic.     Nose: Nose normal.     Mouth/Throat:     Mouth: Mucous membranes are moist.  Eyes:     General: No scleral icterus.     Extraocular Movements: Extraocular movements intact.     Conjunctiva/sclera: Conjunctivae normal.     Pupils: Pupils are equal, round, and reactive to light.  Cardiovascular:     Heart sounds: Normal heart sounds.  Pulmonary:     Effort: Pulmonary effort is normal.     Breath sounds: Normal breath sounds.  Musculoskeletal:        General: Normal range of motion.     Left lower leg: No edema.  Skin:    General: Skin is warm  and dry.     Findings: No rash.  Neurological:     Mental Status: He is alert.  Psychiatric:        Attention and Perception: Attention and perception normal.        Mood and Affect: Mood normal.        Behavior: Behavior normal. Behavior is cooperative.        Thought Content: Thought content normal. Thought content does not include homicidal or suicidal ideation. Thought content does not include homicidal or suicidal plan.        Cognition and Memory: Cognition and memory normal.        Judgment: Judgment normal.    Physical Exam      Results for orders placed or performed in visit on 05/13/24  CBC with Differential/Platelet   Collection Time: 05/13/24  4:04 PM  Result Value Ref Range   WBC 7.6 3.4 - 10.8 x10E3/uL   RBC 5.04 4.14 - 5.80 x10E6/uL   Hemoglobin 16.1 13.0 - 17.7 g/dL   Hematocrit 52.8 62.4 - 51.0 %   MCV 94 79 - 97 fL   MCH 31.9 26.6 - 33.0 pg   MCHC 34.2 31.5 - 35.7 g/dL   RDW 87.0 88.3 - 84.5 %   Platelets 263 150 - 450 x10E3/uL   Neutrophils 63 Not Estab. %   Lymphs 22 Not Estab. %   Monocytes 8 Not Estab. %   Eos 6 Not Estab. %   Basos 1 Not Estab. %   Neutrophils Absolute 4.9 1.4 - 7.0 x10E3/uL   Lymphocytes Absolute 1.7 0.7 - 3.1 x10E3/uL   Monocytes Absolute 0.6 0.1 - 0.9 x10E3/uL   EOS (ABSOLUTE) 0.5 (H) 0.0 - 0.4 x10E3/uL   Basophils Absolute 0.1 0.0 - 0.2 x10E3/uL   Immature Granulocytes 0 Not Estab. %   Immature Grans (Abs) 0.0 0.0 - 0.1 x10E3/uL  CMP14+EGFR   Collection Time: 05/13/24  4:04 PM  Result Value Ref  Range   Glucose 85 70 - 99 mg/dL   BUN 11 6 - 24 mg/dL   Creatinine, Ser 8.96 0.76 - 1.27 mg/dL   eGFR 92 >40 fO/fpw/8.26   BUN/Creatinine Ratio 11 9 - 20   Sodium 138 134 - 144 mmol/L   Potassium 4.5 3.5 - 5.2 mmol/L   Chloride 103 96 - 106 mmol/L   CO2 23 20 - 29 mmol/L   Calcium  9.1 8.7 - 10.2 mg/dL   Total Protein 6.8 6.0 - 8.5 g/dL   Albumin 4.5 4.1 - 5.1 g/dL   Globulin, Total 2.3 1.5 - 4.5 g/dL   Bilirubin Total 0.4 0.0 - 1.2 mg/dL   Alkaline Phosphatase 89 44 - 121 IU/L   AST 15 0 - 40 IU/L   ALT 16 0 - 44 IU/L  HepB+HepC+HIV Panel   Collection Time: 05/13/24  4:04 PM  Result Value Ref Range   Hepatitis B Surface Ag Negative Negative   Hep B E Ag Negative Negative   Hep B C IgM Negative Negative   Hep B Core Total Ab Negative Negative   Hep B E Ab Non Reactive Negative   Hep B Surface Ab, Qual Non Reactive    Hep C Virus Ab Non Reactive Non Reactive   HIV Screen 4th Generation wRfx Non Reactive Non Reactive  Lipid panel   Collection Time: 05/13/24  4:04 PM  Result Value Ref Range   Cholesterol, Total 238 (H) 100 - 199 mg/dL   Triglycerides 813 (H) 0 -  149 mg/dL   HDL 40 >60 mg/dL   VLDL Cholesterol Cal 34 5 - 40 mg/dL   LDL Chol Calc (NIH) 835 (H) 0 - 99 mg/dL   Chol/HDL Ratio 6.0 (H) 0.0 - 5.0 ratio  Thyroid  Panel With TSH   Collection Time: 05/13/24  4:04 PM  Result Value Ref Range   TSH 1.380 0.450 - 4.500 uIU/mL   T4, Total 6.9 4.5 - 12.0 ug/dL   T3 Uptake Ratio 29 24 - 39 %   Free Thyroxine Index 2.0 1.2 - 4.9  VITAMIN D  25 Hydroxy (Vit-D Deficiency, Fractures)   Collection Time: 05/13/24  4:04 PM  Result Value Ref Range   Vit D, 25-Hydroxy 17.2 (L) 30.0 - 100.0 ng/mL       Pertinent labs & imaging results that were available during my care of the patient were reviewed by me and considered in my medical decision making.  Assessment & Plan:  Fount was seen today for mental health and muscle relaxer.  Diagnoses and all orders for this  visit:  Severe episode of recurrent major depressive disorder, without psychotic features (HCC) -     escitalopram  (LEXAPRO ) 20 MG tablet; Take 1 tablet (20 mg total) by mouth daily.  GAD (generalized anxiety disorder) -     escitalopram  (LEXAPRO ) 20 MG tablet; Take 1 tablet (20 mg total) by mouth daily. -     mirtazapine  (REMERON ) 7.5 MG tablet; Take 1 tablet (7.5 mg total) by mouth at bedtime.  Mood swings -     escitalopram  (LEXAPRO ) 20 MG tablet; Take 1 tablet (20 mg total) by mouth daily.  Screening for colon cancer -     Cologuard     Assessment and Plan Benoit is a 44 year old Caucasian male seen today for chronic disease management, no acute distress Assessment & Plan Severe episode of recurrent major depressive disorder, without psychotic features Mood well-managed. Lexapro  dose adjustment considered. - Increased Lexapro  to 20 mg daily.  Generalized anxiety disorder Anxiety persists, especially in social situations and new job settings. Lexapro  dose adjustment and sleep aid considered. - Increased Lexapro  to 20 mg daily. - Prescribed 7.5 mg of Remeron  at bedtime for sleep and anxiety management.  Alcohol use, in early remission Two weeks sober. Acknowledged alcohol's impact on smoking habits. - Continue abstinence from alcohol.  Tobacco use Smoking approximately one pack per day. Advised to quit smoking before age 4. - Advised to quit smoking.  General health maintenance Colonoscopy and lung cancer screening discussed. Cologuard test recommended. Lung cancer screening not indicated until age 53. - Ordered Cologuard test.      Continue all other maintenance medications.  Follow up plan: Return in about 4 weeks (around 10/07/2024) for Mnetal health.   Continue healthy lifestyle choices, including diet (rich in fruits, vegetables, and lean proteins, and low in salt and simple carbohydrates) and exercise (at least 30 minutes of moderate physical activity  daily).  Educational handout given for   Clinical References  Generalized Anxiety Disorder, Adult Generalized anxiety disorder (GAD) is a mental health condition. Unlike normal worries, anxiety related to GAD is not triggered by a specific event. These worries do not fade or get better with time. GAD interferes with relationships, work, and school. GAD symptoms can vary from mild to severe. People with severe GAD can have intense waves of anxiety with physical symptoms that are similar to panic attacks. What are the causes? The exact cause of GAD is not known, but the following are believed  to have an impact: Differences in natural brain chemicals. Genes passed down from parents to children. Differences in the way threats are perceived. Development and stress during childhood. Personality. What increases the risk? The following factors may make you more likely to develop this condition: Being male. Having a family history of anxiety disorders. Being very shy. Experiencing very stressful life events, such as the death of a loved one. Having a very stressful family environment. What are the signs or symptoms? People with GAD often worry excessively about many things in their lives, such as their health and family. Symptoms may also include: Mental and emotional symptoms: Worrying excessively about natural disasters. Fear of being late. Difficulty concentrating. Fears that others are judging your performance. Physical symptoms: Fatigue. Headaches, muscle tension, muscle twitches, trembling, or feeling shaky. Feeling like your heart is pounding or beating very fast. Feeling out of breath or like you cannot take a deep breath. Having trouble falling asleep or staying asleep, or experiencing restlessness. Sweating. Nausea, diarrhea, or irritable bowel syndrome (IBS). Behavioral symptoms: Experiencing erratic moods or irritability. Avoidance of new situations. Avoidance of  people. Extreme difficulty making decisions. How is this diagnosed? This condition is diagnosed based on your symptoms and medical history. You will also have a physical exam. Your health care provider may perform tests to rule out other possible causes of your symptoms. To be diagnosed with GAD, a person must have anxiety that: Is out of his or her control. Affects several different aspects of his or her life, such as work and relationships. Causes distress that makes him or her unable to take part in normal activities. Includes at least three symptoms of GAD, such as restlessness, fatigue, trouble concentrating, irritability, muscle tension, or sleep problems. Before your health care provider can confirm a diagnosis of GAD, these symptoms must be present more days than they are not, and they must last for 6 months or longer. How is this treated? This condition may be treated with: Medicine. Antidepressant medicine is usually prescribed for long-term daily control. Anti-anxiety medicines may be added in severe cases, especially when panic attacks occur. Talk therapy (psychotherapy). Certain types of talk therapy can be helpful in treating GAD by providing support, education, and guidance. Options include: Cognitive behavioral therapy (CBT). People learn coping skills and self-calming techniques to ease their physical symptoms. They learn to identify unrealistic thoughts and behaviors and to replace them with more appropriate thoughts and behaviors. Acceptance and commitment therapy (ACT). This treatment teaches people how to be mindful as a way to cope with unwanted thoughts and feelings. Biofeedback. This process trains you to manage your body's response (physiological response) through breathing techniques and relaxation methods. You will work with a therapist while machines are used to monitor your physical symptoms. Stress management techniques. These include yoga, meditation, and exercise. A  mental health specialist can help determine which treatment is best for you. Some people see improvement with one type of therapy. However, other people require a combination of therapies. Follow these instructions at home: Lifestyle Maintain a consistent routine and schedule. Anticipate stressful situations. Create a plan and allow extra time to work with your plan. Practice stress management or self-calming techniques that you have learned from your therapist or your health care provider. Exercise regularly and spend time outdoors. Eat a healthy diet that includes plenty of vegetables, fruits, whole grains, low-fat dairy products, and lean protein. Do not eat a lot of foods that are high in fat, added sugar, or  salt (sodium). Drink plenty of water. Avoid alcohol. Alcohol can increase anxiety. Avoid caffeine and certain over-the-counter cold medicines. These may make you feel worse. Ask your pharmacist which medicines to avoid. General instructions Take over-the-counter and prescription medicines only as told by your health care provider. Understand that you are likely to have setbacks. Accept this and be kind to yourself as you persist to take better care of yourself. Anticipate stressful situations. Create a plan and allow extra time to work with your plan. Recognize and accept your accomplishments, even if you judge them as small. Spend time with people who care about you. Keep all follow-up visits. This is important. Where to find more information [employer] of Mental [employer]: http://www.maynard.net/ Substance Abuse and Mental Health Services: skateoasis.com.pt Contact a health care provider if: Your symptoms do not get better. Your symptoms get worse. You have signs of depression, such as: A persistently sad or irritable mood. Loss of enjoyment in activities that used to bring you joy. Change in weight or eating. Changes in sleeping habits. Get help right away if: You have thoughts  about hurting yourself or others. If you ever feel like you may hurt yourself or others, or have thoughts about taking your own life, get help right away. Go to your nearest emergency department or: Call your local emergency services (911 in the [place] Call a suicide crisis helpline, such as the National Suicide Prevention Lifeline at 416-168-5941 or 988 in the [place] This is open 24 hours a day in the [place] If youre a Veteran: Call 988 and press 1. This is open 24 hours a day. Text the [employer] at 913-469-3634. Summary Generalized anxiety disorder (GAD) is a mental health condition that involves worry that is not triggered by a specific event. People with GAD often worry excessively about many things in their lives, such as their health and family. GAD may cause symptoms such as restlessness, trouble concentrating, sleep problems, frequent sweating, nausea, diarrhea, headaches, and trembling or muscle twitching. A mental health specialist can help determine which treatment is best for you. Some people see improvement with one type of therapy. However, other people require a combination of therapies. This information is not intended to replace advice given to you by your health care provider. Make sure you discuss any questions you have with your health care provider. Document Revised: 04/05/2023 Document Reviewed: 12/12/2020 Else[employer] Patient Education  2024 [employer] Managing Anxiety, Adult After being diagnosed with anxiety, you may be relieved to know why you have felt or behaved a certain way. You may also feel overwhelmed about the treatment ahead and what it will mean for your life. With care and support, you can manage your anxiety. How to manage lifestyle changes Understanding the difference between stress and anxiety Although stress can play a role in anxiety, it is not the same as anxiety. Stress is your body's reaction to life changes and events, both good and bad. Stress is  often caused by something external, such as a deadline, test, or competition. It normally goes away after the event has ended and will last just a few hours. But, stress can be ongoing and can lead to more than just stress. Anxiety is caused by something internal, such as imagining a terrible outcome or worrying that something will go wrong that will greatly upset you. Anxiety often does not go away even after the event is over, and it can become a long-term (chronic) worry. Lowering stress and anxiety Talk  with your health care provider or a counselor to learn more about lowering anxiety and stress. They may suggest tension-reduction techniques, such as: Music. Spend time creating or listening to music that you enjoy and that inspires you. Mindfulness-based meditation. Practice being aware of your normal breaths while not trying to control your breathing. It can be done while sitting or walking. Centering prayer. Focus on a word, phrase, or sacred image that means something to you and brings you peace. Deep breathing. Expand your stomach and inhale slowly through your nose. Hold your breath for 3-5 seconds. Then breathe out slowly, letting your stomach muscles relax. Self-talk. Learn to notice and spot thought patterns that lead to anxiety reactions. Change those patterns to thoughts that feel peaceful. Muscle relaxation. Take time to tense muscles and then relax them. Choose a tension-reduction technique that fits your lifestyle and personality. These techniques take time and practice. Set aside 5-15 minutes a day to do them. Specialized therapists can offer counseling and training in these techniques. The training to help with anxiety may be covered by some insurance plans. Other things you can do to manage stress and anxiety include: Keeping a stress diary. This can help you learn what triggers your reaction and then learn ways to manage your response. Thinking about how you react to certain  situations. You may not be able to control everything, but you can control your response. Making time for activities that help you relax and not feeling guilty about spending your time in this way. Doing visual imagery. This involves imagining or creating mental pictures to help you relax. Practicing yoga. Through yoga poses, you can lower tension and relax.   Medicines Medicines for anxiety include: Antidepressant medicines. These are usually prescribed for long-term daily control. Anti-anxiety medicines. These may be added in severe cases, especially when panic attacks occur. When used together, medicines, psychotherapy, and tension-reduction techniques may be the most effective treatment. Relationships Relationships can play a big part in helping you recover. Spend more time connecting with trusted friends and family members. Think about going to couples counseling if you have a partner, taking family education classes, or going to family therapy. Therapy can help you and others better understand your anxiety. How to recognize changes in your anxiety Everyone responds differently to treatment for anxiety. Recovery from anxiety happens when symptoms lessen and stop interfering with your daily life at home or work. This may mean that you will start to: Have better concentration and focus. Worry will interfere less in your daily thinking. Sleep better. Be less irritable. Have more energy. Have improved memory. Try to recognize when your condition is getting worse. Contact your provider if your symptoms interfere with home or work and you feel like your condition is not improving. Follow these instructions at home: Activity Exercise. Adults should: Exercise for at least 150 minutes each week. The exercise should increase your heart rate and make you sweat (moderate-intensity exercise). Do strengthening exercises at least twice a week. Get the right amount and quality of sleep. Most adults  need 7-9 hours of sleep each night. Lifestyle  Eat a healthy diet that includes plenty of vegetables, fruits, whole grains, low-fat dairy products, and lean protein. Do not eat a lot of foods that are high in fats, added sugars, or salt (sodium). Make choices that simplify your life. Do not use any products that contain nicotine or tobacco. These products include cigarettes, chewing tobacco, and vaping devices, such as e-cigarettes. If you need help  quitting, ask your provider. Avoid caffeine, alcohol, and certain over-the-counter cold medicines. These may make you feel worse. Ask your pharmacist which medicines to avoid. General instructions Take over-the-counter and prescription medicines only as told by your provider. Keep all follow-up visits. This is to make sure you are managing your anxiety well or if you need more support. Where to find support You can get help and support from: Self-help groups. Online and [employer]. A trusted spiritual leader. Couples counseling. Family education classes. Family therapy. Where to find more information You may find that joining a support group helps you deal with your anxiety. The following sources can help you find counselors or support groups near you: Mental Health [place]: mentalhealthamerica.net Anxiety and Depression Association of America (ADAA): adaa.org The [employer] on Mental Illness (NAMI): nami.org Contact a health care provider if: You have a hard time staying focused or finishing tasks. You spend many hours a day feeling worried about everyday life. You are very tired because you cannot stop worrying. You start to have headaches or often feel tense. You have chronic nausea or diarrhea. Get help right away if: Your heart feels like it is racing. You have shortness of breath. You have thoughts of hurting yourself or others. Get help right away if you feel like you may hurt yourself or others, or have thoughts  about taking your own life. Go to your nearest emergency room or: Call 911. Call the National Suicide Prevention Lifeline at 563-414-7141 or 988. This is open 24 hours a day. Text the Crisis Text Line at 303-211-7050. This information is not intended to replace advice given to you by your health care provider. Make sure you discuss any questions you have with your health care provider. Document Revised: 05/30/2022 Document Reviewed: 12/12/2020 Elsevier Patient Education  2024 [employer]. Feeling Very Unhappy, Dissatisfied, or Uneasy (Dysphoria): What to Do Dysphoria is when someone has very strong feelings of uneasiness, unhappiness, or dissatisfaction. It can also involve feeling sad, worried, or easily annoyed. A person may have symptoms of dysphoria because of many reasons. For example, it may be caused by stressful events or situations, even when the event or situation is expected or known.  If symptoms of dysphoria last longer than two weeks, it's important to talk to a health care professional because the symptoms may be caused by a mental health condition, such as an adjustment disorder, depression, or anxiety disorder. Follow these instructions at home:  Tips and recommendations The following tips may help with managing feelings of dysphoria: Take care of yourself. Eat a healthy diet. Exercise regularly. For example, aim for about 20 minutes of physical activity every day. Make time to do things that you enjoy. Get enough sleep. Doing certain things can help with getting enough sleep. You can: Turn off screens an hour or so before you get ready for sleep. Have a comfortable temperature in the room for sleeping. Do your best to have a regular time for going to bed and waking up. Avoid using substances to cope, including alcohol, nicotine, or other drugs. Talk about your feelings with people you trust, like loved ones, family, or friends. Learn about things that can help with coping and  reducing stress, such as yoga and meditation. Try to do them often. General instructions Take your medicines only as told. Check with your provider before taking any herbs or supplements. Where to find support Your provider. Loved ones, family, or trusted friends. Contact the NAMI HelpLine. Available Monday- Friday,  10 a.m. - 10 p.m. ET. Call 1-800-950-NAMI 320-274-3901). Text HelpLine to 210-105-9181. Email: helpline@nami .org. Where to find more information For information about living with mental health conditions visit: www.nami.org Contact a health care provider if: Your symptoms are not getting better or have lasted longer than 2 weeks. Your symptoms are getting worse. You start to have symptoms you haven't had before, and the symptoms worry you. Get help right away if: You feel like you may hurt yourself or others. You have thoughts about taking your own life. You have other thoughts or feelings that worry you. These symptoms may be an emergency. Take one of these steps right away: Go to your nearest emergency room. Call 911. Contact The 988 Suicide & Crisis Lifeline (24 hours a day, 7 days a week, free and confidential): Call or text 988. Chat online at chat.newsactor.se. For Veterans and their loved ones, contact the [employer]: Call 988 and press 1. Text 720-426-1154. Chat online at veteranscrisisline.net This information is not intended to replace advice given to you by your health care provider. Make sure you discuss any questions you have with your health care provider. Document Revised: 09/13/2023 Document Reviewed: 09/13/2023 Elsevier Patient Education  2025 [employer]. Managing Depression, Adult Depression is a mental health condition that affects your thoughts, feelings, and actions. Being diagnosed with depression can bring you relief if you did not know why you have felt or behaved a certain way. It could also leave you feeling overwhelmed. Finding ways to manage  your symptoms can help you feel more positive about your future. How to manage lifestyle changes Being depressed is difficult. Depression can increase the level of everyday stress. Stress can make depression symptoms worse. You may believe your symptoms cannot be managed or will never improve. However, there are many things you can try to help manage your symptoms. There is hope. Managing stress  Stress is your body's reaction to life changes and events, both good and bad. Stress can add to your feelings of depression. Learning to manage your stress can help lessen your feelings of depression. Try some of the following approaches to reducing your stress (stress reduction techniques): Listen to music that you enjoy and that inspires you. Try using a meditation app or take a meditation class. Develop a practice that helps you connect with your spiritual self. Walk in nature, pray, or go to a place of worship. Practice deep breathing. To do this, inhale slowly through your nose. Pause at the top of your inhale for a few seconds and then exhale slowly, letting yourself relax. Repeat this three or four times. Practice yoga to help relax and work your muscles. Choose a stress reduction technique that works for you. These techniques take time and practice to develop. Set aside 5-15 minutes a day to do them. Therapists can offer training in these techniques. Do these things to help manage stress: Keep a journal. Know your limits. Set healthy boundaries for yourself and others, such as saying no when you think something is too much. Pay attention to how you react to certain situations. You may not be able to control everything, but you can change your reaction. Add humor to your life by watching funny movies or shows. Make time for activities that you enjoy and that relax you. Spend less time using electronics, especially at night before bed. The light from screens can make your brain think it is time to  get up rather than go to bed.  Medicines Medicines, such as antidepressants, are often a part of treatment for depression. Talk with your pharmacist or health care provider about all the medicines, supplements, and herbal products that you take, their possible side effects, and what medicines and other products are safe to take together. Make sure to report any side effects you may have to your health care provider. Relationships Your health care provider may suggest family therapy, couples therapy, or individual therapy as part of your treatment. How to recognize changes Everyone responds differently to treatment for depression. As you recover from depression, you may start to: Have more interest in doing activities. Feel more hopeful. Have more energy. Eat a more regular amount of food. Have better mental focus. It is important to recognize if your depression is not getting better or is getting worse. The symptoms you had in the beginning may return, such as: Feeling tired. Eating too much or too little. Sleeping too much or too little. Feeling restless, agitated, or hopeless. Trouble focusing or making decisions. Having unexplained aches and pains. Feeling irritable, angry, or aggressive. If you or your family members notice these symptoms coming back, let your health care provider know right away. Follow these instructions at home: Activity Try to get some form of exercise each day, such as walking. Try yoga, mindfulness, or other stress reduction techniques. Participate in group activities if you are able. Lifestyle Get enough sleep. Cut down on or stop using caffeine, tobacco, alcohol, and any other harmful substances. Eat a healthy diet that includes plenty of vegetables, fruits, whole grains, low-fat dairy products, and lean protein. Limit foods that are high in solid fats, added sugar, or salt (sodium). General instructions Take over-the-counter and prescription medicines  only as told by your health care provider. Keep all follow-up visits. It is important for your health care provider to check on your mood, behavior, and medicines. Your health care provider may need to make changes to your treatment. Where to find support Talking to others  Friends and family members can be sources of support and guidance. Talk to trusted friends or family members about your condition. Explain your symptoms and let them know that you are working with a health care provider to treat your depression. Tell friends and family how they can help. Finances Find mental health providers that fit with your financial situation. Talk with your health care provider if you are worried about access to food, housing, or medicine. Call your insurance company to learn about your co-pays and prescription plan. Where to find more information You can find support in your area from: Anxiety and Depression Association of America (ADAA): adaa.org Mental Health America: mentalhealthamerica.net The [employer] American on Mental Illness: nami.org Contact a health care provider if: You stop taking your antidepressant medicines, and you have any of these symptoms: Nausea. Headache. Light-headedness. Chills and body aches. Not being able to sleep (insomnia). You or your friends and family think your depression is getting worse. Get help right away if: You have thoughts of hurting yourself or others. Get help right away if you feel like you may hurt yourself or others, or have thoughts about taking your own life. Go to your nearest emergency room or: Call 911. Call the National Suicide Prevention Lifeline at 703-111-0325 or 988. This is open 24 hours a day. Text the Crisis Text Line at (505) 525-2832. This information is not intended to replace advice given to you by your health care provider. Make sure you discuss any questions you have with  your health care provider. Document Revised: 12/27/2021 Document  Reviewed: 12/27/2021 Elsevier Patient Education  2024 [employer]  The above assessment and management plan was discussed with the patient. The patient verbalized understanding of and has agreed to the management plan. Patient is aware to call the clinic if they develop any new symptoms or if symptoms persist or worsen. Patient is aware when to return to the clinic for a follow-up visit. Patient educated on when it is appropriate to go to the emergency department.   Nena Cassis Morton Hummer, DNP [facility] [address] 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 PATIENT IS NOT SURE WHETHER HE IS ALLERGIC TO THIS MEDICATION   "

## 2024-09-29 ENCOUNTER — Encounter: Admitting: Professional Counselor

## 2024-09-30 ENCOUNTER — Telehealth: Payer: Self-pay | Admitting: Nurse Practitioner

## 2024-09-30 ENCOUNTER — Ambulatory Visit: Admitting: Nurse Practitioner

## 2024-09-30 NOTE — Telephone Encounter (Signed)
 Called and left message to reschedule appt on 09/30/24 due to provider being out of the office

## 2024-10-07 ENCOUNTER — Ambulatory Visit: Admitting: Nurse Practitioner

## 2024-10-07 ENCOUNTER — Other Ambulatory Visit: Payer: Self-pay | Admitting: Nurse Practitioner

## 2024-10-07 DIAGNOSIS — E559 Vitamin D deficiency, unspecified: Secondary | ICD-10-CM

## 2024-10-10 ENCOUNTER — Encounter: Admitting: Family Medicine

## 2024-10-13 ENCOUNTER — Encounter: Admitting: Professional Counselor
